# Patient Record
Sex: Female | Born: 1950 | Race: White | Hispanic: No | Smoking: Current every day smoker
Health system: Southern US, Community
[De-identification: ages and names within clinical notes are randomized; demographics above are authoritative.]

## PROBLEM LIST (undated history)

## (undated) DIAGNOSIS — J209 Acute bronchitis, unspecified: Principal | ICD-10-CM

## (undated) DIAGNOSIS — F54 Psychological and behavioral factors associated with disorders or diseases classified elsewhere: Secondary | ICD-10-CM

## (undated) DIAGNOSIS — F419 Anxiety disorder, unspecified: Secondary | ICD-10-CM

## (undated) DIAGNOSIS — E039 Hypothyroidism, unspecified: Secondary | ICD-10-CM

## (undated) DIAGNOSIS — J189 Pneumonia, unspecified organism: Secondary | ICD-10-CM

## (undated) DIAGNOSIS — E119 Type 2 diabetes mellitus without complications: Secondary | ICD-10-CM

## (undated) DIAGNOSIS — F32A Depression, unspecified: Secondary | ICD-10-CM

## (undated) DIAGNOSIS — F329 Major depressive disorder, single episode, unspecified: Secondary | ICD-10-CM

## (undated) DIAGNOSIS — I2510 Atherosclerotic heart disease of native coronary artery without angina pectoris: Secondary | ICD-10-CM

## (undated) DIAGNOSIS — E785 Hyperlipidemia, unspecified: Secondary | ICD-10-CM

## (undated) DIAGNOSIS — T1491XA Suicide attempt, initial encounter: Secondary | ICD-10-CM

## (undated) DIAGNOSIS — R631 Polydipsia: Secondary | ICD-10-CM

## (undated) HISTORY — DX: Hypothyroidism, unspecified: E03.9

## (undated) HISTORY — DX: Type 2 diabetes mellitus without complications: E11.9

## (undated) HISTORY — DX: Suicide attempt, initial encounter: T14.91XA

## (undated) HISTORY — DX: Pneumonia, unspecified organism: J18.9

## (undated) HISTORY — DX: Anxiety disorder, unspecified: F41.9

## (undated) HISTORY — PX: OTHER SURGICAL HISTORY: SHX169

## (undated) HISTORY — DX: Major depressive disorder, single episode, unspecified: F32.9

## (undated) HISTORY — DX: Atherosclerotic heart disease of native coronary artery without angina pectoris: I25.10

## (undated) HISTORY — DX: Psychological and behavioral factors associated with disorders or diseases classified elsewhere: F54

## (undated) HISTORY — PX: TONSILLECTOMY: SHX5217

## (undated) HISTORY — DX: Depression, unspecified: F32.A

## (undated) HISTORY — DX: Polydipsia: R63.1

## (undated) HISTORY — DX: Hyperlipidemia, unspecified: E78.5

## (undated) HISTORY — DX: Acute bronchitis, unspecified: J20.9

---

## 1997-06-05 ENCOUNTER — Other Ambulatory Visit: Admission: RE | Admit: 1997-06-05 | Discharge: 1997-06-05 | Payer: Self-pay | Admitting: Internal Medicine

## 1997-07-09 ENCOUNTER — Other Ambulatory Visit: Admission: RE | Admit: 1997-07-09 | Discharge: 1997-07-09 | Payer: Self-pay | Admitting: Radiology

## 1998-01-13 ENCOUNTER — Other Ambulatory Visit: Admission: RE | Admit: 1998-01-13 | Discharge: 1998-01-13 | Payer: Self-pay | Admitting: Obstetrics and Gynecology

## 1998-01-30 DIAGNOSIS — F54 Psychological and behavioral factors associated with disorders or diseases classified elsewhere: Secondary | ICD-10-CM

## 1998-01-30 HISTORY — DX: Psychological and behavioral factors associated with disorders or diseases classified elsewhere: F54

## 1998-11-28 ENCOUNTER — Emergency Department (HOSPITAL_COMMUNITY): Admission: EM | Admit: 1998-11-28 | Discharge: 1998-11-28 | Payer: Self-pay | Admitting: Internal Medicine

## 1998-11-29 ENCOUNTER — Inpatient Hospital Stay (HOSPITAL_COMMUNITY): Admission: EM | Admit: 1998-11-29 | Discharge: 1998-12-01 | Payer: Self-pay | Admitting: Infectious Diseases

## 2000-02-07 ENCOUNTER — Other Ambulatory Visit: Admission: RE | Admit: 2000-02-07 | Discharge: 2000-02-07 | Payer: Self-pay | Admitting: Obstetrics and Gynecology

## 2000-06-22 ENCOUNTER — Encounter: Payer: Self-pay | Admitting: Internal Medicine

## 2000-06-22 ENCOUNTER — Ambulatory Visit (HOSPITAL_COMMUNITY): Admission: RE | Admit: 2000-06-22 | Discharge: 2000-06-22 | Payer: Self-pay | Admitting: Internal Medicine

## 2000-10-08 ENCOUNTER — Ambulatory Visit (HOSPITAL_BASED_OUTPATIENT_CLINIC_OR_DEPARTMENT_OTHER): Admission: RE | Admit: 2000-10-08 | Discharge: 2000-10-08 | Payer: Self-pay | Admitting: Orthopedic Surgery

## 2001-04-09 ENCOUNTER — Other Ambulatory Visit: Admission: RE | Admit: 2001-04-09 | Discharge: 2001-04-09 | Payer: Self-pay | Admitting: Obstetrics and Gynecology

## 2004-04-08 ENCOUNTER — Ambulatory Visit: Payer: Self-pay | Admitting: Internal Medicine

## 2004-06-03 ENCOUNTER — Ambulatory Visit: Payer: Self-pay | Admitting: Internal Medicine

## 2004-07-15 ENCOUNTER — Ambulatory Visit: Payer: Self-pay | Admitting: Internal Medicine

## 2005-01-30 HISTORY — PX: APPENDECTOMY: SHX54

## 2005-07-04 ENCOUNTER — Ambulatory Visit: Payer: Self-pay | Admitting: Internal Medicine

## 2005-07-30 DIAGNOSIS — J189 Pneumonia, unspecified organism: Secondary | ICD-10-CM

## 2005-07-30 HISTORY — DX: Pneumonia, unspecified organism: J18.9

## 2005-08-23 ENCOUNTER — Ambulatory Visit: Payer: Self-pay | Admitting: Internal Medicine

## 2005-08-23 ENCOUNTER — Inpatient Hospital Stay (HOSPITAL_COMMUNITY): Admission: AD | Admit: 2005-08-23 | Discharge: 2005-08-27 | Payer: Self-pay | Admitting: Internal Medicine

## 2005-08-24 ENCOUNTER — Ambulatory Visit: Payer: Self-pay | Admitting: Internal Medicine

## 2005-09-07 ENCOUNTER — Ambulatory Visit: Payer: Self-pay | Admitting: Internal Medicine

## 2006-06-17 ENCOUNTER — Emergency Department (HOSPITAL_COMMUNITY): Admission: EM | Admit: 2006-06-17 | Discharge: 2006-06-17 | Payer: Self-pay | Admitting: Emergency Medicine

## 2006-07-05 ENCOUNTER — Ambulatory Visit: Payer: Self-pay | Admitting: Internal Medicine

## 2006-07-11 ENCOUNTER — Ambulatory Visit: Payer: Self-pay | Admitting: Internal Medicine

## 2006-08-17 ENCOUNTER — Ambulatory Visit: Payer: Self-pay | Admitting: Internal Medicine

## 2006-08-17 LAB — CONVERTED CEMR LAB
ALT: 28 units/L (ref 0–35)
Albumin: 3.6 g/dL (ref 3.5–5.2)
Alkaline Phosphatase: 70 units/L (ref 39–117)
Cholesterol: 147 mg/dL (ref 0–200)
Glucose, Bld: 90 mg/dL (ref 70–99)
Total Bilirubin: 0.6 mg/dL (ref 0.3–1.2)
Total CHOL/HDL Ratio: 3.1
Total Protein: 6.4 g/dL (ref 6.0–8.3)

## 2006-09-07 ENCOUNTER — Ambulatory Visit: Payer: Self-pay | Admitting: Internal Medicine

## 2006-09-21 ENCOUNTER — Encounter: Admission: RE | Admit: 2006-09-21 | Discharge: 2006-09-21 | Payer: Self-pay | Admitting: Internal Medicine

## 2006-11-01 ENCOUNTER — Ambulatory Visit (HOSPITAL_COMMUNITY): Admission: RE | Admit: 2006-11-01 | Discharge: 2006-11-01 | Payer: Self-pay | Admitting: Family Medicine

## 2006-11-02 ENCOUNTER — Inpatient Hospital Stay (HOSPITAL_COMMUNITY): Admission: EM | Admit: 2006-11-02 | Discharge: 2006-11-04 | Payer: Self-pay | Admitting: Emergency Medicine

## 2006-11-02 ENCOUNTER — Encounter (INDEPENDENT_AMBULATORY_CARE_PROVIDER_SITE_OTHER): Payer: Self-pay | Admitting: General Surgery

## 2007-02-15 ENCOUNTER — Ambulatory Visit: Payer: Self-pay | Admitting: Internal Medicine

## 2007-02-15 DIAGNOSIS — E039 Hypothyroidism, unspecified: Secondary | ICD-10-CM | POA: Insufficient documentation

## 2007-02-15 DIAGNOSIS — F329 Major depressive disorder, single episode, unspecified: Secondary | ICD-10-CM

## 2007-02-15 DIAGNOSIS — E785 Hyperlipidemia, unspecified: Secondary | ICD-10-CM

## 2007-02-15 DIAGNOSIS — E119 Type 2 diabetes mellitus without complications: Secondary | ICD-10-CM

## 2007-02-15 DIAGNOSIS — F411 Generalized anxiety disorder: Secondary | ICD-10-CM | POA: Insufficient documentation

## 2007-02-15 HISTORY — DX: Type 2 diabetes mellitus without complications: E11.9

## 2007-07-05 ENCOUNTER — Telehealth: Payer: Self-pay | Admitting: Internal Medicine

## 2007-07-12 ENCOUNTER — Ambulatory Visit: Payer: Self-pay | Admitting: Internal Medicine

## 2007-07-12 LAB — CONVERTED CEMR LAB
ALT: 25 units/L (ref 0–35)
AST: 23 units/L (ref 0–37)
Albumin: 3.6 g/dL (ref 3.5–5.2)
BUN: 15 mg/dL (ref 6–23)
CO2: 28 meq/L (ref 19–32)
Calcium: 9.3 mg/dL (ref 8.4–10.5)
Chloride: 106 meq/L (ref 96–112)
Cholesterol: 158 mg/dL (ref 0–200)
Creatinine, Ser: 0.6 mg/dL (ref 0.4–1.2)
HDL: 42.9 mg/dL (ref >39.0)
Hgb A1c MFr Bld: 6.3 % — ABNORMAL HIGH (ref 4.6–6.0)
LDL Cholesterol: 107 mg/dL — ABNORMAL HIGH (ref 0–99)
TSH: 0.38 microintl units/mL (ref 0.35–5.50)
Triglycerides: 40 mg/dL (ref 0–149)

## 2007-08-16 ENCOUNTER — Ambulatory Visit: Payer: Self-pay | Admitting: Internal Medicine

## 2007-08-16 DIAGNOSIS — K589 Irritable bowel syndrome without diarrhea: Secondary | ICD-10-CM | POA: Insufficient documentation

## 2007-09-11 ENCOUNTER — Encounter: Payer: Self-pay | Admitting: Internal Medicine

## 2007-10-04 ENCOUNTER — Ambulatory Visit: Payer: Self-pay | Admitting: Internal Medicine

## 2007-10-04 DIAGNOSIS — R42 Dizziness and giddiness: Secondary | ICD-10-CM | POA: Insufficient documentation

## 2007-10-04 DIAGNOSIS — I4581 Long QT syndrome: Secondary | ICD-10-CM

## 2007-10-28 ENCOUNTER — Telehealth (INDEPENDENT_AMBULATORY_CARE_PROVIDER_SITE_OTHER): Payer: Self-pay | Admitting: *Deleted

## 2007-11-06 ENCOUNTER — Ambulatory Visit: Payer: Self-pay

## 2007-11-06 ENCOUNTER — Encounter: Payer: Self-pay | Admitting: Internal Medicine

## 2007-11-07 ENCOUNTER — Ambulatory Visit: Payer: Self-pay | Admitting: Internal Medicine

## 2008-01-31 ENCOUNTER — Emergency Department (HOSPITAL_COMMUNITY): Admission: EM | Admit: 2008-01-31 | Discharge: 2008-01-31 | Payer: Self-pay | Admitting: Emergency Medicine

## 2008-07-31 ENCOUNTER — Ambulatory Visit: Payer: Self-pay | Admitting: Internal Medicine

## 2008-07-31 DIAGNOSIS — R109 Unspecified abdominal pain: Secondary | ICD-10-CM

## 2008-07-31 LAB — CONVERTED CEMR LAB
Albumin: 3.5 g/dL (ref 3.5–5.2)
Alkaline Phosphatase: 73 units/L (ref 39–117)
Basophils Relative: 0.1 % (ref 0.0–3.0)
CO2: 29 meq/L (ref 19–32)
Chloride: 106 meq/L (ref 96–112)
Eosinophils Absolute: 0 10*3/uL (ref 0.0–0.7)
HCT: 40.2 % (ref 36.0–46.0)
Hemoglobin: 13.9 g/dL (ref 12.0–15.0)
Hgb A1c MFr Bld: 6.3 % (ref 4.6–6.5)
Lymphocytes Relative: 10.3 % — ABNORMAL LOW (ref 12.0–46.0)
Lymphs Abs: 1.4 10*3/uL (ref 0.7–4.0)
MCHC: 34.5 g/dL (ref 30.0–36.0)
MCV: 97.8 fL (ref 78.0–100.0)
Monocytes Absolute: 0.7 10*3/uL (ref 0.1–1.0)
Neutro Abs: 11.8 10*3/uL — ABNORMAL HIGH (ref 1.4–7.7)
Potassium: 3.6 meq/L (ref 3.5–5.1)
RBC: 4.11 M/uL (ref 3.87–5.11)
Sodium: 140 meq/L (ref 135–145)
Total CHOL/HDL Ratio: 3
Total Protein: 6.5 g/dL (ref 6.0–8.3)
Urine Glucose: NEGATIVE mg/dL
Urobilinogen, UA: 0.2 (ref 0.0–1.0)

## 2008-07-31 LAB — HM DIABETES FOOT EXAM

## 2008-08-04 ENCOUNTER — Encounter: Payer: Self-pay | Admitting: Internal Medicine

## 2008-08-05 ENCOUNTER — Ambulatory Visit: Payer: Self-pay | Admitting: Internal Medicine

## 2008-08-05 ENCOUNTER — Encounter (INDEPENDENT_AMBULATORY_CARE_PROVIDER_SITE_OTHER): Payer: Self-pay | Admitting: *Deleted

## 2008-08-05 DIAGNOSIS — R1032 Left lower quadrant pain: Secondary | ICD-10-CM | POA: Insufficient documentation

## 2008-10-21 ENCOUNTER — Ambulatory Visit: Payer: Self-pay | Admitting: Internal Medicine

## 2008-10-21 DIAGNOSIS — J019 Acute sinusitis, unspecified: Secondary | ICD-10-CM

## 2008-10-26 ENCOUNTER — Telehealth: Payer: Self-pay | Admitting: Internal Medicine

## 2008-10-28 ENCOUNTER — Ambulatory Visit: Payer: Self-pay | Admitting: Internal Medicine

## 2009-05-07 ENCOUNTER — Encounter: Payer: Self-pay | Admitting: Internal Medicine

## 2009-07-02 ENCOUNTER — Ambulatory Visit: Payer: Self-pay | Admitting: Internal Medicine

## 2009-07-02 DIAGNOSIS — D72829 Elevated white blood cell count, unspecified: Secondary | ICD-10-CM | POA: Insufficient documentation

## 2009-07-02 LAB — CONVERTED CEMR LAB
ALT: 18 units/L (ref 0–35)
Alkaline Phosphatase: 72 units/L (ref 39–117)
Basophils Absolute: 0.1 10*3/uL (ref 0.0–0.1)
Bilirubin, Direct: 0.1 mg/dL (ref 0.0–0.3)
Calcium: 9.4 mg/dL (ref 8.4–10.5)
Cholesterol: 159 mg/dL (ref 0–200)
Eosinophils Absolute: 0 10*3/uL (ref 0.0–0.7)
GFR calc non Af Amer: 115.19 mL/min (ref >60)
HCT: 40.8 % (ref 36.0–46.0)
Lymphs Abs: 2.4 10*3/uL (ref 0.7–4.0)
MCV: 97.9 fL (ref 78.0–100.0)
Monocytes Absolute: 0.5 10*3/uL (ref 0.1–1.0)
Platelets: 215 10*3/uL (ref 150.0–400.0)
RDW: 13.8 % (ref 11.5–14.6)
Sodium: 141 meq/L (ref 135–145)
Total Protein: 6.6 g/dL (ref 6.0–8.3)
Triglycerides: 54 mg/dL (ref 0.0–149.0)

## 2009-07-30 ENCOUNTER — Ambulatory Visit: Payer: Self-pay | Admitting: Internal Medicine

## 2009-08-20 ENCOUNTER — Ambulatory Visit: Payer: Self-pay | Admitting: Internal Medicine

## 2009-09-08 ENCOUNTER — Ambulatory Visit: Payer: Self-pay | Admitting: Internal Medicine

## 2009-09-09 ENCOUNTER — Telehealth: Payer: Self-pay | Admitting: Internal Medicine

## 2009-09-14 ENCOUNTER — Ambulatory Visit: Payer: Self-pay | Admitting: Internal Medicine

## 2009-09-14 ENCOUNTER — Encounter: Payer: Self-pay | Admitting: Internal Medicine

## 2009-09-16 ENCOUNTER — Telehealth (INDEPENDENT_AMBULATORY_CARE_PROVIDER_SITE_OTHER): Payer: Self-pay | Admitting: *Deleted

## 2009-09-20 ENCOUNTER — Encounter: Payer: Self-pay | Admitting: Internal Medicine

## 2010-02-20 ENCOUNTER — Encounter: Payer: Self-pay | Admitting: Internal Medicine

## 2010-03-01 NOTE — Progress Notes (Signed)
Summary: ALT med  Phone Note Call from Patient   Caller: Patient [phone] Summary of Call: Pt called stating that she may have had a reaction to Levothyroxine but she will not longer continue on this medication. Pt states that she has not been taking medications for a while (pt was unclear on if she stopped medications due to reaction, type of reaction, or when she had the reaction). Pt is requesting an alternative medication, Synthroid. Pt says she will pick Rx up in office tomorrow 09/10/2009 Initial call taken by: Margaret Pyle, CMA,  September 09, 2009 3:45 PM  Follow-up for Phone Call        ok to change to levothyroid same strength - to robin to handle Follow-up by: Corwin Levins MD,  September 09, 2009 4:23 PM  Additional Follow-up for Phone Call Additional follow up Details #1::        completed new prescription, patient will pickup at front desk 09/10/09. Informed pt. prescription is ready. Additional Follow-up by: Robin Ewing CMA (AAMA),  September 10, 2009 9:06 AM    New/Updated Medications: LEVOTHROID 125 MCG TABS (LEVOTHYROXINE SODIUM) 1 by mouth once daily Prescriptions: LEVOTHROID 125 MCG TABS (LEVOTHYROXINE SODIUM) 1 by mouth once daily  #30 x 11   Entered by:   Zella Ball Ewing CMA (AAMA)   Authorized by:   Corwin Levins MD   Signed by:   Scharlene Gloss CMA (AAMA) on 09/10/2009   Method used:   Print then Give to Patient   RxID:   [id number]

## 2010-03-01 NOTE — Miscellaneous (Signed)
Summary: Orders Update  Clinical Lists Changes  Orders: Added new Test order of Carotid Duplex (Carotid Duplex) - Signed 

## 2010-03-01 NOTE — Letter (Signed)
Summary: [facility]   Imported By: Lester [place] 05/19/2009 09:59:43  _____________________________________________________________________  External Attachment:    Type:   Image     Comment:   External Document

## 2010-03-01 NOTE — Miscellaneous (Signed)
Summary: Orders Update  Clinical Lists Changes  Orders: Added new Service order of EKG w/ Interpretation (93000) - Signed 

## 2010-03-01 NOTE — Assessment & Plan Note (Signed)
Summary: SORE THROAT/NWS   Vital Signs:  Patient profile:   60 year old female Height:      61 inches Weight:      108.50 pounds BMI:     20.57 O2 Sat:      97 % on Room air Temp:     99.1 degrees F oral Pulse rate:   77 / minute BP sitting:   92 / 60  (left arm) Cuff size:   regular  Vitals Entered By: Zella Ball Ewing CMA Duncan Dull) (August 20, 2009 2:24 PM)  O2 Flow:  Room air CC: Sore Throat/RE   Primary Care Provider:  Corwin Levins MD  CC:  Sore Throat/RE.  History of Present Illness: Here with acute onset x 3 days fever, ST , headache, general aches , weakness and malaise, but Pt denies CP, sob, doe, wheezing, orthopnea, pnd, worsening LE edema, palps, dizziness or syncope  co-worker with proven strep.  Denies hyper or hypothyroid symtpoms such as wt/voice/skin changes.  Pt denies polydipsia, polyuria, or low sugar symptoms such as shakiness improved with eating.  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 1po Qd 2)  Lovastatin 20 Mg  Tabs (Lovastatin) .Marland Kitchen.. 1 By Mouth Once Daily 3)  Levothyroxine Sodium 125 Mcg Tabs (Levothyroxine Sodium) .Marland Kitchen.. 1po Once Daily 4)  Ecotrin Low Strength 81 Mg  Tbec (Aspirin) .Marland Kitchen.. 1po Qd 5)  Advil 200 Mg  Tabs (Ibuprofen) .Marland Kitchen.. 1 By Mouth Once Daily  Prn 6)  Accu-Chek Aviva   Strp (Glucose Blood) .... Use As Directed 7)  Risperdal 3 Mg Tabs (Risperidone) .... Take 1 Tablet By Mouth Two Times A Day  Current Medications (verified): 1)  Sertraline Hcl 100 Mg  Tabs (Sertraline Hcl) .Marland Kitchen.. 1po Qd 2)  Lovastatin 20 Mg  Tabs (Lovastatin) .Marland Kitchen.. 1 By Mouth Once Daily 3)  Levothyroxine Sodium 125 Mcg Tabs (Levothyroxine Sodium) .Marland Kitchen.. 1po Once Daily 4)  Ecotrin Low Strength 81 Mg  Tbec (Aspirin) .Marland Kitchen.. 1po Qd 5)  Advil 200 Mg  Tabs (Ibuprofen) .Marland Kitchen.. 1 By Mouth Once Daily Prn 6)  Accu-Chek Aviva   Strp (Glucose Blood) .... Use As Directed 7)  Risperdal 3 Mg Tabs (Risperidone) .... Take 1 Tablet By Mouth Two Times A Day 8)  Azithromycin 250 Mg Tabs (Azithromycin) .... 2po Qd For 1 Day, Then 1po Qd For 4days, Then Stop  Allergies (verified): 1)  ! Prozac 2)  ! 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  , mild ill  Head:  normocephalic and atraumatic.   Eyes:  vision grossly intact, pupils equal, and pupils round.   Ears:  bilat tm's red, sinus nontender Nose:  nasal dischargemucosal pallor and mucosal edema.   Mouth:  pharyngeal erythema and fair dentition.   Neck:  supple and cervical lymphadenopathy.   Lungs:  normal respiratory effort and normal breath sounds.   Heart:  normal rate and regular rhythm.   Extremities:   no edema, no erythema    Impression & Recommendations:  Problem # 1:  PHARYNGITIS-ACUTE (ICD-462)  Her updated medication list for this problem includes:    Ecotrin Low Strength 81 Mg Tbec (Aspirin) .Marland Kitchen... 1po qd    Advil 200 Mg Tabs (Ibuprofen) .Marland Kitchen... 1 by mouth once daily prn    Azithromycin 250 Mg Tabs (Azithromycin) .Marland Kitchen... 2po qd for 1 day, then 1po qd for 4days, then stop treat as above, f/u any worsening signs or symptoms   Problem # 2:  DIABETES MELLITUS, TYPE II (ICD-250.00)  Her updated medication list for this problem includes:    Ecotrin Low Strength 81 Mg Tbec (Aspirin) .Marland Kitchen... 1po qd  Labs Reviewed: Creat: 0.6 (07/02/2009)    Reviewed HgBA1c results: 6.4 (07/02/2009)  6.3 (07/31/2008) stable overall by hx and exam, ok to continue meds/tx as is   Problem # 3:  HYPOTHYROIDISM (ICD-244.9)  Her updated medication list for this problem includes:    Levothyroxine Sodium 125 Mcg Tabs (Levothyroxine sodium) .Marland Kitchen... 1po once daily  Labs Reviewed: TSH: 0.38 (07/30/2009)    HgBA1c: 6.4 (07/02/2009) Chol: 159 (07/02/2009)   HDL: 49.10 (07/02/2009)   LDL: 99 (07/02/2009)   TG: 54.0 (07/02/2009) stable overall by hx and exam, ok to continue meds/tx as is   Complete Medication List: 1)  Sertraline Hcl 100 Mg Tabs (Sertraline hcl) .Marland Kitchen.. 1po qd 2)  Lovastatin 20 Mg Tabs (Lovastatin) .Marland Kitchen.. 1 by mouth once daily 3)  Levothyroxine Sodium 125 Mcg Tabs (Levothyroxine sodium) .Marland Kitchen.. 1po once daily 4)  Ecotrin Low Strength 81 Mg Tbec (Aspirin) .Marland Kitchen.. 1po qd 5)  Advil 200 Mg Tabs (Ibuprofen) .Marland Kitchen.. 1 by mouth once daily prn 6)  Accu-chek Aviva Strp (Glucose blood) .... Use as directed 7)  Risperdal 3 Mg Tabs (Risperidone) .... Take 1 tablet by mouth two times a day 8)  Azithromycin 250 Mg Tabs (Azithromycin) .... 2po qd for 1 day, then 1po qd for 4days, then stop  Patient Instructions: 1)  Please take all new medications as prescribed 2)  Continue all previous medications as before this visit    3)  You can also use Mucinex OTC or it's generic for congestion  4)  Please schedule a follow-up appointment as needed. Prescriptions: AZITHROMYCIN 250 MG TABS (AZITHROMYCIN) 2po qd for 1 day, then 1po qd for 4days, then stop  #6 x 1   Entered and Authorized by:   Corwin Levins MD   Signed by:   Corwin Levins MD on 08/20/2009   Method used:   Print then Give to Patient   RxID:   [id number]

## 2010-03-01 NOTE — Assessment & Plan Note (Signed)
Summary: FU / MAY NEED LABS/ NWS   Vital Signs:  Patient profile:   60 year old female Height:      61 inches Weight:      109.75 pounds BMI:     20.81 O2 Sat:      99 % on Room air Temp:     96.6 degrees F oral Pulse rate:   68 / minute BP sitting:   132 / 84  (left arm) Cuff size:   regular  Vitals Entered ByZella Ball Ewing (July 02, 2009 9:17 AM)  O2 Flow:  Room air  CC: followup/RE   Primary Care Provider:  Corwin Levins MD  CC:  followup/RE.  History of Present Illness: on risperdal gained 6 lbs since last yr; Pt denies CP, sob, doe, wheezing, orthopnea, pnd, worsening LE edema, palps, dizziness or syncope  Pt denies new neuro symptoms such as headache, facial or extremity weakness   trying to follow lower chol diet;  denies polydipsia or polyuria.    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 1po Qd 2)  Lovastatin 20 Mg  Tabs (Lovastatin) .Marland Kitchen.. 1 By Mouth Qd 3)  Levothyroxine Sodium 150 Mcg  Tabs (Levothyroxine Sodium) .Marland Kitchen.. 1 By Mouth Qd 4)  Ecotrin Low Strength 81 Mg  Tbec (Aspirin) .Marland Kitchen.. 1po Qd 5)  Advil 200 Mg  Tabs (Ibuprofen) .Marland Kitchen.. 1 By Mouth Once Daily Prn 6)  Accu-Chek Aviva   Strp (Glucose Blood) .... Use As Directed 7)  Risperdal 3 Mg Tabs (Risperidone) .... Take 1 Tablet By Mouth Two Times A Day 8)  Tessalon Perles 100 Mg Caps (Benzonatate) .Marland Kitchen.. 1 - 2 By Mouth Three Times A Day As Needed For Cough 9)  Zithromax Z-Pak 250 Mg Tabs (Azithromycin)  Current Medications  (verified): 1)  Sertraline Hcl 100 Mg  Tabs (Sertraline Hcl) .Marland Kitchen.. 1po Qd 2)  Lovastatin 20 Mg  Tabs (Lovastatin) .Marland Kitchen.. 1 By Mouth Once Daily 3)  Levothyroxine Sodium 150 Mcg  Tabs (Levothyroxine Sodium) .Marland Kitchen.. 1 By Mouth Once Daily 4)  Ecotrin Low Strength 81 Mg  Tbec (Aspirin) .Marland Kitchen.. 1po Qd 5)  Advil 200 Mg  Tabs (Ibuprofen) .Marland Kitchen.. 1 By Mouth Once Daily Prn 6)  Accu-Chek Aviva   Strp (Glucose Blood) .... Use As Directed 7)  Risperdal 3 Mg Tabs (Risperidone) .... Take 1 Tablet By Mouth Two Times A Day  Allergies (verified): 1)  ! Prozac 2)  ! 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   Head:  normocephalic and atraumatic.   Eyes:  vision grossly intact, pupils equal, and pupils round.   Ears:  R ear normal and L ear normal.   Nose:  no external deformity and no nasal discharge.   Mouth:  no gingival abnormalities and pharynx pink and moist.   Neck:  supple and no masses.   Lungs:  normal respiratory effort and normal breath sounds.   Heart:  normal rate and regular rhythm.   Abdomen:  soft, non-tender, and normal bowel sounds.   Msk:  no joint tenderness and no joint swelling.   Extremities:   no edema, no erythema  Neurologic:  cranial nerves II-XII intact and strength normal in all extremities.   Psych:  normally interactive and not anxious appearing.     Impression & Recommendations:  Problem # 1:  HYPERLIPIDEMIA (ICD-272.4)  Her updated medication list for this problem includes:    Lovastatin 20 Mg Tabs (Lovastatin) .Marland Kitchen... 1 by mouth once daily  Orders: TLB-Hepatic/Liver Function Pnl (80076-HEPATIC)  Labs Reviewed: SGOT: 21 (07/31/2008)   SGPT: 24 (07/31/2008)   HDL:51.30 (07/31/2008), 42.9 (07/12/2007)  LDL:87 (07/31/2008), 107 (40/98/1191)  Chol:145 (07/31/2008), 158 (07/12/2007)  Trig:34.0 (07/31/2008), 40 (07/12/2007) stable overall by hx and exam, ok to continue meds/tx as is   Problem # 2:  DIABETES MELLITUS, TYPE II (ICD-250.00)  Her updated medication list for this problem includes:    Ecotrin Low Strength 81 Mg Tbec (Aspirin) .Marland Kitchen... 1po qd  Orders: TLB-BMP (Basic Metabolic Panel-BMET) (80048-METABOL) TLB-A1C / Hgb A1C (Glycohemoglobin) (83036-A1C) TLB-Lipid Panel (80061-LIPID)  Labs Reviewed: Creat: 0.6 (07/31/2008)    Reviewed HgBA1c results: 6.3 (07/31/2008)  6.3 (07/12/2007) stable overall by hx and exam, ok to continue meds/tx as is   Problem # 3:  HYPOTHYROIDISM (ICD-244.9)  Her updated medication list for this problem includes:    Levothyroxine Sodium 125 Mcg Tabs (Levothyroxine sodium) .Marland Kitchen... 1po once daily  Orders: TLB-TSH (Thyroid Stimulating Hormone) (84443-TSH)  Labs Reviewed: TSH: 1.45 (07/31/2008)    HgBA1c: 6.3 (07/31/2008) Chol: 145 (07/31/2008)   HDL: 51.30 (07/31/2008)   LDL: 87 (07/31/2008)   TG: 34.0 (07/31/2008) stable overall by hx and exam, ok to continue meds/tx as is , to check TSH today  Problem # 4:  LEUKOCYTOSIS (ICD-288.60)  Orders: TLB-CBC Platelet - w/Differential (85025-CBCD) mild previously, ? clinical significance - to re-check  Complete Medication List: 1)  Sertraline Hcl 100 Mg Tabs (Sertraline hcl) .Marland Kitchen..  1po qd 2)  Lovastatin 20 Mg Tabs (Lovastatin) .Marland Kitchen.. 1 by mouth once daily 3)  Levothyroxine Sodium 125 Mcg Tabs (Levothyroxine sodium) .Marland Kitchen.. 1po once daily 4)  Ecotrin Low Strength 81 Mg Tbec (Aspirin) .Marland Kitchen.. 1po qd 5)  Advil 200 Mg Tabs (Ibuprofen) .Marland Kitchen.. 1 by mouth once daily prn 6)  Accu-chek Aviva Strp (Glucose blood) .... Use as directed 7)  Risperdal 3 Mg Tabs (Risperidone) .... Take 1 tablet by mouth two times a day  Patient Instructions: 1)  please go to [email or web address].[email or web address] for infomation about health insurance  2)  Continue all previous medications as before this visit  3)  Please go to the Lab in the basement for your blood and/or urine tests today  4)  Please schedule a follow-up appointment in 1 year or sooner if needed Prescriptions: ACCU-CHEK AVIVA   STRP (  GLUCOSE BLOOD) Use as directed  #100 x 3   Entered and Authorized by:   Corwin Levins MD   Signed by:   Corwin Levins MD on 07/02/2009   Method used:   Print then Give to Patient   RxID:   [id number] LEVOTHYROXINE SODIUM 150 MCG  TABS (LEVOTHYROXINE SODIUM) 1 by mouth once daily  #90 x 3   Entered and Authorized by:   Corwin Levins MD   Signed by:   Corwin Levins MD on 07/02/2009   Method used:   Print then Give to Patient   RxID:   [id number] LOVASTATIN 20 MG  TABS (LOVASTATIN) 1 by mouth once daily  #90 x 3   Entered and Authorized by:   Corwin Levins MD   Signed by:   Corwin Levins MD on 07/02/2009   Method used:   Print then Give to Patient   RxID:   [id number]

## 2010-03-01 NOTE — Progress Notes (Signed)
----   Converted from flag ---- ---- 09/15/2009 11:28 AM, Edman Circle wrote: appt 8/23 @ 4:00  ---- 09/15/2009 11:13 AM, Dagoberto Reef wrote: Thanks  ---- 09/14/2009 3:31 PM, Corwin Levins MD wrote: The following orders have been entered for this patient and placed on Admin Hold:  Type:     Referral       Code:   Radiology Description:   Radiology Referral Order Date:   09/14/2009   Authorized By:   Corwin Levins MD Order #:   [id number] Clinical Notes:   Name of Test or Procedure: carotid dopplers  Of What:  Special Instructions ------------------------------

## 2010-03-01 NOTE — Assessment & Plan Note (Signed)
Summary: HEAD FEEL FUNNY X AWHILE--STC   Vital Signs:  Patient profile:   60 year old female Height:      61 inches Weight:      109.25 pounds BMI:     20.72 O2 Sat:      97 % on Room air Temp:     99.6 degrees F oral Pulse rate:   73 / minute BP supine:   120 / 92  (right arm) BP sitting:   110 / 86  (right arm) BP standing:   112 / 88  (right arm) Cuff size:   regular  Vitals Entered By: Zella Ball Ewing CMA Duncan Dull) (September 14, 2009 2:27 PM)  O2 Flow:  Room air CC: Continued to be dizzy in the mornings after taking medication/RE   Primary Care Provider:  Corwin Levins MD  CC:  Continued to be dizzy in the mornings after taking medication/RE.  History of Present Illness: here to f/u; unfortuantely still with signifciant postioinal dizziness and "just feeling funny in the head" worse in the am to first getting up in the AM despite change of thyroid med, and even holdiing her risperdal in the am only for the last 3 days b/c the information with the risperdal mentioned dizziness, but did not make any difference in how she felt;  recent labs reviewed with  pt, as well as normal 2009 echo;  Pt denies CP, sob, doe, wheezing, orthopnea, pnd, worsening LE edema, palps,  or syncope Pt denies new neuro symptoms such as headache, facial or extremity weakness  No fever, wt loss, night sweats, loss of appetite or other constitutional symptoms  No headache, ST, cough, chills, abd pain, n/v and very difficult to say if her dizziness is vertigo-like, faintness or dysquilibrium like.  Overall o/w good med complaicne and tolerance.   Denies worsening hyper or hypothyroid symtpoms, such as voice/wt/skin changes.Denies polydipsia or polyuria.   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 1po Qd 2)  Lovastatin 20 Mg  Tabs (Lovastatin) .Marland Kitchen.. 1 By Mouth Once Daily 3)  Levothyroxine Sodium 125 Mcg Tabs (Levothyroxine Sodium) .Marland Kitchen.. 1po Once Daily 4)  Ecotrin Low Strength 81 Mg  Tbec (Aspirin) .Marland Kitchen.. 1po Qd 5)  Advil 200 Mg  Tabs (Ibuprofen) .Marland Kitchen.. 1 By Mouth Once Daily Prn 6)  Accu-Chek Aviva   Strp (Glucose Blood) .... Use As Directed 7)  Risperdal 3 Mg Tabs (Risperidone) .... Take 1 Tablet By Mouth Two Times A Day 8)  Azithromycin 250 Mg Tabs (Azithromycin) .... 2po Qd For 1 Day, Then 1po Qd For 4days, Then Stop 9)  Levothroid 125 Mcg Tabs (Levothyroxine Sodium) .Marland Kitchen.. 1 By Mouth Once Daily  Current Medications (verified): 1)  Sertraline Hcl 100 Mg  Tabs (Sertraline Hcl) .Marland Kitchen.. 1po Qd 2)  Lovastatin 20 Mg  Tabs (Lovastatin) .Marland Kitchen.. 1 By Mouth Once Daily 3)  Levothyroxine Sodium 125 Mcg Tabs (Levothyroxine Sodium) .Marland Kitchen.. 1po Once Daily 4)  Ecotrin Low Strength 81 Mg  Tbec (Aspirin) .Marland Kitchen.. 1po Qd 5)  Advil 200 Mg  Tabs (Ibuprofen) .Marland Kitchen.. 1 By Mouth Once Daily Prn 6)  Accu-Chek Aviva   Strp (Glucose Blood) .... Use As Directed 7)  Risperdal 3 Mg Tabs (Risperidone) .... Take 1 Tablet By Mouth Two Times A Day 8)  Azithromycin 250 Mg Tabs (Azithromycin) .... 2po Qd For 1 Day, Then 1po Qd For 4days, Then Stop 9)  Levothroid 125 Mcg Tabs (Levothyroxine Sodium) .Marland Kitchen.. 1 By Mouth Once Daily  Allergies (verified): 1)  ! Prozac 2)  ! 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   Head:  normocephalic and atraumatic.   Eyes:  vision grossly intact, pupils equal, and pupils round.   Ears:  R ear normal and L ear normal.   Nose:  no external deformity and no nasal discharge.   Mouth:  no gingival abnormalities and pharynx pink and moist.   Neck:  supple and no masses.   Lungs:  normal respiratory effort and normal breath sounds.   Heart:  normal rate and regular rhythm.   Abdomen:  soft, non-tender, and normal bowel sounds.   Msk:  no joint tenderness and no joint warmth.  , no flank tender Extremities:  no edema, no erythema  Neurologic:  cranial nerves II-XII intact, strength normal in all extremities, and gait normal.     Impression & Recommendations:  Problem # 1:  DIZZINESS (ICD-780.4)  ? mild orthostatic by BP today - ok for incr fluids, and extra salt over the next 3 to 5 days; also to check head mri and carotids;  recent echo 2009 reviewed with pt; consider neuro and/or card consult  Orders: Radiology Referral (Radiology) Radiology Referral (Radiology)  Problem # 2:  HYPOTHYROIDISM (ICD-244.9)  The following medications were removed from the medication list:    Levothyroxine Sodium 125 Mcg Tabs (Levothyroxine sodium) .Marland Kitchen... 1po once daily Her updated medication list for this problem includes:    Levothroid 125 Mcg Tabs (Levothyroxine sodium) .Marland Kitchen... 1 by mouth once daily  Labs Reviewed: TSH: 0.60 (09/08/2009)    HgBA1c: 6.4 (07/02/2009) Chol: 159 (07/02/2009)   HDL: 49.10 (07/02/2009)   LDL: 99 (07/02/2009)   TG:  54.0 (07/02/2009) stable overall by hx and exam, ok to continue meds/tx as is   Problem # 3:  DIABETES MELLITUS, TYPE II (ICD-250.00)  Her updated medication list for this problem includes:    Ecotrin Low Strength 81 Mg Tbec (Aspirin) .Marland Kitchen... 1po qd  Labs Reviewed: Creat: 0.6 (07/02/2009)    Reviewed HgBA1c results: 6.4 (07/02/2009)  6.3 (07/31/2008) stable overall by hx and exam, ok to continue meds/tx as is  - no need for OHA at this time .    Complete Medication List: 1)  Sertraline Hcl 100 Mg Tabs (Sertraline hcl) .Marland Kitchen.. 1po qd 2)  Lovastatin 20 Mg Tabs (Lovastatin) .Marland Kitchen.. 1 by mouth once daily 3)  Ecotrin Low Strength 81 Mg Tbec (Aspirin) .Marland Kitchen.. 1po qd 4)  Advil 200 Mg Tabs (Ibuprofen) .Marland Kitchen.. 1 by mouth once daily prn 5)  Accu-chek Aviva Strp (Glucose blood) .... Use as directed 6)  Risperdal 3 Mg Tabs (Risperidone) .... Take 1 tablet by mouth two times a day 7)  Levothroid 125 Mcg Tabs (Levothyroxine sodium) .Marland Kitchen.. 1 by mouth once daily  Patient Instructions: 1)  Continue all previous medications as before this visit  - including the thyroid and risperdal medications 2)  Please drink more fluids, and even use extra table salt over the next 3 to 5 days 3)  You will be contacted about the referral(s) to: Carotid dopplers, and Head MRI 4)  call if not improved in 1 wk to consider Neuro consult, or cardiology evaluation

## 2010-03-01 NOTE — Assessment & Plan Note (Signed)
Summary: dizziness and slow breatning---stc   Vital Signs:  Patient profile:   60 year old female Height:      61 inches Weight:      109.50 pounds BMI:     20.76 O2 Sat:      97 % on Room air Temp:     98.5 degrees F oral Pulse rate:   78 / minute BP sitting:   98 / 64  (left arm) Cuff size:   regular  Vitals Entered By: Zella Ball Ewing CMA Duncan Dull) (September 08, 2009 12:00 PM)  O2 Flow:  Room air CC: Dizzined and slowed breathing/RE   Primary Care Neema Fluegge:  Corwin Levins MD  CC:  Dizzined and slowed breathing/RE.  History of Present Illness: c/o 4 to 5 dyas onset dizziness that seemed to start with taking her meds in the am, breathing seemed to be slowed,  seemed better after eating  and 'just made me feel wierd.'  Quit taking her thyroid med yesterady and today to see if this would help., as well as less coffee.  No headache, fever, sinus complaints, and no ear pain, pressure,  but has had some ringing and  popping noises to both ears;  no ST, cough, Pt denies CP, sob, doe, wheezing, orthopnea, pnd, worsening LE edema, palps, or syncope   Pt denies new neuro symptoms such as headache, facial or extremity weakness .  Dizziness is mild , intermittent and worse with head movements , but no nausea.   Denies polydipsia or polyuria.    Problems Prior to Update: 1)  Dizziness  (ICD-780.4) 2)  Pharyngitis-acute  (ICD-462) 3)  Leukocytosis  (ICD-288.60) 4)  Sinusitis- Acute-nos  (ICD-461.9) 5)  Abdominal Pain, Left Lower Quadrant  (ICD-789.04) 6)  Abdominal Pain Other Specified Site  (ICD-789.09) 7)  Long Qt Syndrome  (ICD-426.82) 8)  Dizziness  (ICD-780.4) 9)  Ibs  (ICD-564.1) 10)  Hyperlipidemia  (ICD-272.4) 11)  Anxiety  (ICD-300.00) 12)  Depression  (ICD-311) 13)  Diabetes Mellitus, Type II  (ICD-250.00) 14)  Hypothyroidism  (ICD-244.9)  Medications Prior to Update: 1)  Sertraline Hcl 100 Mg  Tabs (Sertraline Hcl) .Marland Kitchen.. 1po Qd 2)  Lovastatin 20 Mg  Tabs (Lovastatin) .Marland Kitchen.. 1 By Mouth  Once Daily 3)  Levothyroxine Sodium 125 Mcg Tabs (Levothyroxine Sodium) .Marland Kitchen.. 1po Once Daily 4)  Ecotrin Low Strength 81 Mg  Tbec (Aspirin) .Marland Kitchen.. 1po Qd 5)  Advil 200 Mg  Tabs (Ibuprofen) .Marland Kitchen.. 1 By Mouth Once Daily Prn 6)  Accu-Chek Aviva   Strp (Glucose Blood) .... Use As Directed 7)  Risperdal 3 Mg Tabs (Risperidone) .... Take 1 Tablet By Mouth Two Times A Day 8)  Azithromycin 250 Mg Tabs (Azithromycin) .... 2po Qd For 1 Day, Then 1po Qd For 4days, Then Stop  Current Medications (verified): 1)  Sertraline Hcl 100 Mg  Tabs (Sertraline Hcl) .Marland Kitchen.. 1po Qd 2)  Lovastatin 20 Mg  Tabs (Lovastatin) .Marland Kitchen.. 1 By Mouth Once Daily 3)  Levothyroxine Sodium 125 Mcg Tabs (Levothyroxine Sodium) .Marland Kitchen.. 1po Once Daily 4)  Ecotrin Low Strength 81 Mg  Tbec (Aspirin) .Marland Kitchen.. 1po Qd 5)  Advil 200 Mg  Tabs (Ibuprofen) .Marland Kitchen.. 1 By Mouth Once Daily Prn 6)  Accu-Chek Aviva   Strp (Glucose Blood) .... Use As Directed 7)  Risperdal 3 Mg Tabs (Risperidone) .... Take 1 Tablet By Mouth Two Times A Day 8)  Azithromycin 250 Mg Tabs (Azithromycin) .... 2po Qd For 1 Day, Then 1po Qd For 4days, Then Stop  Allergies (verified): 1)  ! Prozac 2)  ! 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   Head:  normocephalic and atraumatic.   Eyes:  vision grossly intact, pupils equal, and pupils round.     Ears:  lefrt tm mild erythema, right tm ok, sinus nontender Nose:  nasal dischargemucosal pallor and mucosal erythema.   Mouth:  pharyngeal erythema and fair dentition.   Neck:  supple and no masses.   Lungs:  normal respiratory effort and normal breath sounds.   Heart:  normal rate and regular rhythm.   Abdomen:  soft, non-tender, and normal bowel sounds.   Extremities:  no edema, no erythema  Neurologic:  alert & oriented X3, strength normal in all extremities, and finger-to-nose normal.     Impression & Recommendations:  Problem # 1:  HYPOTHYROIDISM (ICD-244.9)  Her updated medication list for this problem includes:    Levothyroxine Sodium 125 Mcg Tabs (Levothyroxine sodium) .Marland Kitchen... 1po once daily  Orders: TLB-TSH (Thyroid Stimulating Hormone) (84443-TSH)  Labs Reviewed: TSH: 0.38 (07/30/2009)    HgBA1c: 6.4 (07/02/2009) Chol: 159 (07/02/2009)   HDL: 49.10 (07/02/2009)   LDL: 99 (07/02/2009)   TG: 54.0 (07/02/2009) to re-check tsh.after med adjusted after last visit, but doubt this to be etiology of he acute dizziness  Problem # 2:  DIZZINESS (ICD-780.4) I think c/w vertigo, but she defers trial of meclizine for now; I suspect may be due to left inner ear etilogy but she is not convinced  Problem # 3:  DIABETES MELLITUS, TYPE II (ICD-250.00)  Her updated medication list for this problem includes:    Ecotrin Low Strength 81 Mg Tbec (Aspirin) .Marland Kitchen... 1po qd  Labs Reviewed: Creat: 0.6 (07/02/2009)    Reviewed HgBA1c results: 6.4 (07/02/2009)  6.3 (07/31/2008) stable overall by hx and exam, ok to continue meds/tx as is - no OHA needed at this time  Complete Medication List: 1)  Sertraline Hcl 100 Mg Tabs (Sertraline hcl) .Marland Kitchen.. 1po qd 2)  Lovastatin 20 Mg Tabs (Lovastatin) .Marland Kitchen.. 1 by mouth once daily 3)  Levothyroxine Sodium 125 Mcg Tabs (Levothyroxine sodium) .Marland Kitchen.. 1po once daily 4)  Ecotrin Low Strength 81 Mg Tbec (Aspirin) .Marland Kitchen.. 1po qd 5)  Advil 200 Mg Tabs (Ibuprofen) .Marland Kitchen.. 1  by mouth once daily prn 6)  Accu-chek Aviva Strp (Glucose blood) .... Use as directed 7)  Risperdal 3 Mg Tabs (Risperidone) .... Take 1 tablet by mouth two times a day 8)  Azithromycin 250 Mg Tabs (Azithromycin) .... 2po qd for 1 day, then 1po qd for 4days, then stop  Patient Instructions: 1)  Please go to the Lab in the basement for your blood  tests today for the thyroid 2)  OK to hold the thyroid medication for now; but if the blood test is normal, you can re-start the medication at the same dose 3)  If the blood test is not normal, we will have to  change the dose 4)  Continue all previous medications as before this visit  5)  Please schedule a follow-up appointment as needed.

## 2010-04-13 ENCOUNTER — Encounter: Payer: Self-pay | Admitting: Internal Medicine

## 2010-04-13 ENCOUNTER — Ambulatory Visit (INDEPENDENT_AMBULATORY_CARE_PROVIDER_SITE_OTHER): Payer: Self-pay | Admitting: Internal Medicine

## 2010-04-13 DIAGNOSIS — J029 Acute pharyngitis, unspecified: Secondary | ICD-10-CM

## 2010-04-13 DIAGNOSIS — R030 Elevated blood-pressure reading, without diagnosis of hypertension: Secondary | ICD-10-CM | POA: Insufficient documentation

## 2010-04-13 DIAGNOSIS — E039 Hypothyroidism, unspecified: Secondary | ICD-10-CM

## 2010-04-13 DIAGNOSIS — R42 Dizziness and giddiness: Secondary | ICD-10-CM

## 2010-04-19 NOTE — Assessment & Plan Note (Signed)
Summary: blood pressure issues/cd   Vital Signs:  Patient profile:   60 year old female Height:      61 inches Weight:      111.25 pounds BMI:     21.10 O2 Sat:      96 % on Room air Temp:     98.4 degrees F oral Pulse rate:   74 / minute BP sitting:   122 / 78  (left arm) Cuff size:   regular  Vitals Entered By: Zella Ball Ewing CMA Duncan Dull) (April 13, 2010 2:58 PM)  O2 Flow:  Room air CC: BP elevated/RE   Primary Care Provider:  Corwin Levins MD  CC:  BP elevated/RE.  History of Present Illness: here with onset mild URI symptoms , now with worsening ST but no cough and Pt denies CP, worsening sob, doe, wheezing, orthopnea, pnd, worsening LE edema, palps,  or syncope.  Pt denies new neuro symptoms such as headache, facial or extremity weakness  Pt denies polydipsia, polyuria  Overall good compliance with meds, trying to follow low chol diet, wt stable, little excercise however  Overall good compliance with meds, and good tolerability.  Denies worsening depressive symptoms, suicidal ideation, or panic and keeps up regular f/u with psychiatry.  Did have elev BP 3 days ago with onset of symptoms, became concerned and called EMS with BP since then more controlled.  Has recurrent dizziness, with some lightheadedness, but taking by mouth well, and has ongoing symtpoms for many months.   Preventive Screening-Counseling & Management      Drug Use:  no.    Problems Prior to Update: 1)  Elevated Blood Pressure Without Diagnosis of Hypertension  (ICD-796.2) 2)  Pharyngitis-acute  (ICD-462) 3)  Dizziness  (ICD-780.4) 4)  Leukocytosis  (ICD-288.60) 5)  Sinusitis- Acute-nos  (ICD-461.9) 6)  Abdominal Pain, Left Lower Quadrant  (ICD-789.04) 7)  Abdominal Pain Other Specified Site  (ICD-789.09) 8)  Long Qt Syndrome  (ICD-426.82) 9)  Dizziness  (ICD-780.4) 10)  Ibs  (ICD-564.1) 11)  Hyperlipidemia  (ICD-272.4) 12)  Anxiety  (ICD-300.00) 13)  Depression  (ICD-311) 14)  Diabetes Mellitus, Type II   (ICD-250.00) 15)  Hypothyroidism  (ICD-244.9)  Medications Prior to Update: 1)  Sertraline Hcl 100 Mg  Tabs (Sertraline Hcl) .Marland Kitchen.. 1po Qd 2)  Lovastatin 20 Mg  Tabs (Lovastatin) .Marland Kitchen.. 1 By Mouth Once Daily 3)  Ecotrin Low Strength 81 Mg  Tbec (Aspirin) .Marland Kitchen.. 1po Qd 4)  Advil 200 Mg  Tabs (Ibuprofen) .Marland Kitchen.. 1 By Mouth Once Daily Prn 5)  Accu-Chek Aviva   Strp (Glucose Blood) .... Use As Directed 6)  Risperdal 3 Mg Tabs (Risperidone) .... Take 1 Tablet By Mouth Two Times A Day 7)  Levothroid 125 Mcg Tabs (Levothyroxine Sodium) .Marland Kitchen.. 1 By Mouth Once Daily  Current Medications (verified): 1)  Sertraline Hcl 100 Mg  Tabs (Sertraline Hcl) .Marland Kitchen.. 1po Qd 2)  Lovastatin 20 Mg  Tabs (Lovastatin) .Marland Kitchen.. 1 By Mouth Once Daily 3)  Ecotrin Low Strength 81 Mg  Tbec (Aspirin) .Marland Kitchen.. 1po Qd 4)  Advil 200 Mg  Tabs (Ibuprofen) .Marland Kitchen.. 1 By Mouth Once Daily Prn 5)  Accu-Chek Aviva   Strp (Glucose Blood) .... Use As Directed 6)  Risperdal 3 Mg Tabs (Risperidone) .... Take 1 Tablet By Mouth Two Times A Day 7)  Levothroid 125 Mcg Tabs (Levothyroxine Sodium) .Marland Kitchen.. 1 By Mouth Once Daily 8)  Doxycycline Hyclate 100 Mg Caps (Doxycycline Hyclate) .Marland Kitchen.. 1 By Mouth Two Times A Day  Allergies (verified): 1)  ! Prozac 2)  ! 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   Head:  normocephalic and atraumatic.   Eyes:  vision grossly intact, pupils equal, and pupils round.   Ears:  bilat tm's mild erythema, sinus nontender Nose:  nasal dischargemucosal pallor and mucosal edema.   Mouth:  pharyngeal erythema and fair dentition.   Neck:  supple and cervical lymphadenopathy.   Lungs:  normal respiratory effort and normal breath sounds.   Heart:  normal rate and regular rhythm.   Extremities:  no edema, no erythema  Skin:  color normal and no rashes.   Psych:  not depressed appearing and slightly anxious.     Impression & Recommendations:  Problem # 1:  PHARYNGITIS-ACUTE (ICD-462)  Her updated medication list for this problem includes:    Ecotrin Low Strength 81 Mg Tbec (Aspirin) .Marland Kitchen... 1po qd    Advil 200 Mg Tabs (Ibuprofen) .Marland Kitchen... 1 by mouth once daily prn    Doxycycline Hyclate 100 Mg Caps (Doxycycline hyclate) .Marland Kitchen... 1 by mouth two times a day treat as above, f/u any worsening signs or symptoms   Instructed to complete antibiotics and call if not improved in 48 hours.   Problem # 2:  DIZZINESS (ICD-780.4)  ? related to anxiety and hyperventilation; exam o/w ok for now, declines further w/u at this time as she has no insurance, such as labs, ecg, echo, cxr   Problem # 3:  ELEVATED BLOOD PRESSURE WITHOUT DIAGNOSIS OF HYPERTENSION (ICD-796.2) improved,mild elev today, likely situational, ok to follow, continue same treatment   Problem # 4:  HYPOTHYROIDISM (ICD-244.9)  Her updated medication list for this problem includes:    Levothroid 125 Mcg Tabs (Levothyroxine sodium) .Marland Kitchen... 1 by mouth once daily  Labs Reviewed: TSH: 0.60 (09/08/2009)    HgBA1c: 6.4 (07/02/2009) Chol: 159 (07/02/2009)   HDL: 49.10 (07/02/2009)   LDL: 99 (07/02/2009)   TG: 54.0 (07/02/2009) d/w pt - delcines further labs at this time  Complete Medication  List: 1)  Sertraline Hcl 100 Mg Tabs (Sertraline hcl) .Marland Kitchen.. 1po qd 2)  Lovastatin 20 Mg Tabs (Lovastatin) .Marland Kitchen.. 1 by mouth once daily 3)  Ecotrin Low Strength 81 Mg Tbec (Aspirin) .Marland Kitchen.. 1po qd 4)  Advil 200 Mg Tabs (Ibuprofen) .Marland Kitchen.. 1 by mouth once daily prn 5)  Accu-chek Aviva Strp (Glucose blood) .... Use as directed 6)  Risperdal 3 Mg Tabs (Risperidone) .... Take 1 tablet by mouth two times a day 7)  Levothroid 125 Mcg Tabs (Levothyroxine sodium) .Marland Kitchen.. 1 by mouth once daily 8)  Doxycycline Hyclate 100 Mg Caps (Doxycycline hyclate) .Marland Kitchen.. 1 by mouth two times a day  Patient Instructions: 1)  Please take all new medications as prescribed 2)  Continue all previous medications as  before this visit  3)  Please schedule a follow-up appointment in 6 months, or sooner if needed Prescriptions: DOXYCYCLINE HYCLATE 100 MG CAPS (DOXYCYCLINE HYCLATE) 1 by mouth two times a day  #20 x 0   Entered and Authorized by:   Corwin Levins MD   Signed by:   Corwin Levins MD on 04/13/2010   Method used:   Print then Give to Patient   RxID:   [id number]    Orders Added: 1)  Est. Patient Level IV [14782]

## 2010-05-05 ENCOUNTER — Encounter: Payer: Self-pay | Admitting: Internal Medicine

## 2010-05-06 ENCOUNTER — Ambulatory Visit (INDEPENDENT_AMBULATORY_CARE_PROVIDER_SITE_OTHER): Payer: Self-pay | Admitting: Family Medicine

## 2010-05-06 ENCOUNTER — Encounter: Payer: Self-pay | Admitting: Family Medicine

## 2010-05-06 VITALS — BP 112/78 | HR 83 | Temp 98.0°F | Ht 61.0 in | Wt 109.1 lb

## 2010-05-06 DIAGNOSIS — R030 Elevated blood-pressure reading, without diagnosis of hypertension: Secondary | ICD-10-CM

## 2010-05-06 DIAGNOSIS — J209 Acute bronchitis, unspecified: Secondary | ICD-10-CM

## 2010-05-06 DIAGNOSIS — E119 Type 2 diabetes mellitus without complications: Secondary | ICD-10-CM

## 2010-05-06 MED ORDER — AZITHROMYCIN 250 MG PO TABS: ORAL_TABLET | ORAL | Status: AC

## 2010-05-06 NOTE — Patient Instructions (Signed)
Acute Bronchitis Bronchitis is a problem of the air tubes leading to your lungs. Acute means the illness started quickly. In this condition, the lining of those tubes becomes puffy (swollen) and can leak fluid. This makes it harder for air to get in and out of your lungs. You may cough a lot. This is because the air tubes are narrow. Bronchitis is most often caused by a virus. Medicines that kill germs (antibiotics) may be needed with germ (bacteria) infections for people who:  Smoke.   Have lasting (chronic) lung problems.   Are elderly.  HOME CARE  Rest.   Drink enough water and fluids to keep the pee clear or pale yellow.   Only take medicine as told by your doctor.   Medicines may be prescribed that will open up the airways. This will help make breathing easier.   Bronchitis usually gets better on its own in a few days.  Recovery from some problems (symptoms) of bronchitis may be slow. You should start feeling a little better after 2 to 3 days. Coughing may last for 3 to 4 weeks. GET HELP RIGHT AWAY IF:  You or your child has a temperature by mouth above 143f, not controlled by medicine.   Chills or chest pain develops.   You or your child develops very bad shortness of breath.   There is bloody saliva mixed with mucus (sputum).   You or your child throws up (vomits) often, loses too much fluid (dehydration), feels faint, or has a very bad headache.   You or your child does not improve after 1 week of treatment.  MAKE SURE YOU:   Understand these instructions.   Will watch this condition.   Will get help right away if you or your child is not doing well or gets worse.  Document Released: 07/05/2007 Document Re-Released: 04/12/2009 [facility] Patient Information 2011 [place], [place].

## 2010-05-07 ENCOUNTER — Encounter: Payer: Self-pay | Admitting: Family Medicine

## 2010-05-07 DIAGNOSIS — J209 Acute bronchitis, unspecified: Secondary | ICD-10-CM

## 2010-05-07 HISTORY — DX: Acute bronchitis, unspecified: J20.9

## 2010-05-07 NOTE — Progress Notes (Signed)
  Subjective:    Patient ID: [name], female    DOB: Sep 26, 1950, 59 y.o.   MRN: [id number]  HPI Patient is in today for evaluation of multiple symptoms including fatigue, malaise,cough, midl generalized HA, sore throat, myalgias and nasal congesiton. Many symptoms have been present for over a month but the cough and congestion have worsened recently. She is a smoker and continues to smoke a PPD. No CP/palp/SOB/GI or GU c/o.   Review of Systems  Constitutional: Positive for activity change. Negative for fever, chills and appetite change.  HENT: Positive for congestion. Negative for ear pain, nosebleeds, sneezing, neck pain and postnasal drip.   Respiratory: Positive for cough. Negative for chest tightness and shortness of breath.   Cardiovascular: Negative for chest pain, palpitations and leg swelling.  Gastrointestinal: Negative for abdominal pain.  Musculoskeletal: Positive for myalgias. Negative for back pain.  Neurological: Positive for headaches. Negative for syncope.  Psychiatric/Behavioral: Negative for agitation. The patient is not nervous/anxious.        Objective:   Physical Exam  Constitutional: She is oriented to person, place, and time. She appears well-developed and well-nourished. No distress.  HENT:  Head: Normocephalic and atraumatic.  Right Ear: External ear normal.  Left Ear: External ear normal.  Nose: Nose normal.       Oropharynx erythematous not edematous  Eyes: Conjunctivae are normal.  Neck: Normal range of motion.  Cardiovascular: Normal rate, regular rhythm and normal heart sounds.   No murmur heard. Pulmonary/Chest: Effort normal. No respiratory distress. She has no wheezes. She has no rales.       RLL rhonchi, all else clear to auscultation  Abdominal: She exhibits no mass.  Musculoskeletal: She exhibits no edema.  Lymphadenopathy:    She has no cervical adenopathy.  Neurological: She is alert and oriented to person, place, and time.  Skin:  Skin is warm. She is not diaphoretic.  Psychiatric: She has a normal mood and affect.          Assessment & Plan:  ELEVATED BLOOD PRESSURE WITHOUT DIAGNOSIS OF HYPERTENSION Good control, avoid sodium  DIABETES MELLITUS, TYPE II Patient denies any polyuria or polydipsia  Acute bronchitis Patient with rll rhonchi, will rx with Azithromycina and patient will follow up withpmd if symptoms do not improve.

## 2010-05-07 NOTE — Assessment & Plan Note (Signed)
Patient denies any polyuria or polydipsia

## 2010-05-07 NOTE — Assessment & Plan Note (Signed)
Good control, avoid sodium

## 2010-05-07 NOTE — Assessment & Plan Note (Signed)
Patient with rll rhonchi, will rx with Azithromycina and patient will follow up withpmd if symptoms do not improve.

## 2010-05-16 LAB — DIFFERENTIAL
Basophils Relative: 0 % (ref 0–1)
Lymphs Abs: 2.6 10*3/uL (ref 0.7–4.0)
Monocytes Absolute: 0.5 10*3/uL (ref 0.1–1.0)
Monocytes Relative: 6 % (ref 3–12)
Neutro Abs: 6.6 10*3/uL (ref 1.7–7.7)

## 2010-05-16 LAB — URINALYSIS, ROUTINE W REFLEX MICROSCOPIC
Bilirubin Urine: NEGATIVE
Hgb urine dipstick: NEGATIVE
Nitrite: NEGATIVE
Specific Gravity, Urine: 1.007 (ref 1.005–1.030)
pH: 6 (ref 5.0–8.0)

## 2010-05-16 LAB — POCT I-STAT, CHEM 8
BUN: 15 mg/dL (ref 6–23)
Calcium, Ion: 1.2 mmol/L (ref 1.12–1.32)
Chloride: 97 mEq/L (ref 96–112)
Creatinine, Ser: 0.7 mg/dL (ref 0.4–1.2)
Glucose, Bld: 97 mg/dL (ref 70–99)
HCT: 46 % (ref 36.0–46.0)

## 2010-05-16 LAB — CBC
Hemoglobin: 14.2 g/dL (ref 12.0–15.0)
MCHC: 34 g/dL (ref 30.0–36.0)
RBC: 4.25 MIL/uL (ref 3.87–5.11)
WBC: 9.7 10*3/uL (ref 4.0–10.5)

## 2010-05-16 LAB — POCT CARDIAC MARKERS
CKMB, poc: 3.2 ng/mL (ref 1.0–8.0)
Troponin i, poc: <0.05 ng/mL (ref 0.00–0.09)

## 2010-06-14 NOTE — H&P (Signed)
[name]               ACCOUNT NO.:  [id number]   MEDICAL RECORD NO.:  [id number]          PATIENT TYPE:  INP   LOCATION:  1616                         FACILITY:  [facility]   PHYSICIAN:  Anselm Pancoast. Weatherly, M.D.DATE OF BIRTH:  1950-05-31   DATE OF ADMISSION:  11/01/2006  DATE OF DISCHARGE:                              HISTORY & PHYSICAL   CHIEF COMPLAINT:  Severe abdominal pain.   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  She works for [employer] in [place] and then started having  lower abdominal pain, was then taken to the [facility], worked  up, found to have a white count, and was sent over for a CT which was  completed at approximately 1 a.m. that shows a markedly inflamed  appendix.  She was then advised to come on over to the emergency room  and I was called at approximately 1:30 a.m.   She is on chronic medications for psychiatric.  She is on:  1. Zoloft, probably 20 mg a day.  2. Geodon 40 mg b.i.d.  3. Lovastatin for cholesterol.  4. Synthroid, she does not know the dosage.   PREVIOUS SURGERIES:  1. 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  2. She has had a right knee surgery.  3. She had a tonsillectomy as a child.   She lists allergies to AVELOX.   FAMILY HISTORY:  The patient is widowed.  She did not reveal the details  of her husband's death.  She says it is, a long story.   PHYSICAL EXAMINATION:  GENERAL:  She is a middle-aged female appears  slightly older than her stated age.  ABDOMEN:  She has definitely got a tender abdomen in the right lower  quadrant.  Tender abdomen, right lower quadrant with __________ guarding  and rebound.  It has not really __________ generalized.  I would not be  surprised if she has got an early ruptured appendicitis.  VITAL SIGNS:  Temperature 99.5.  Pulse 89.  Respirations 20.  Blood  pressure 129/87.  She weighs 110  pounds.  CHEST:  Clear.  BREASTS:  Not examined.  CARDIAC:  Normal sinus rhythm.  PELVIC:  Not done.   CT scan revealed a very swollen appendix.   ASSESSMENT:  Acute appendicitis.   We will order 3 grams of Unasyn STAT.  She needs an EKG and a chest x-  ray.  We will plan on an open appendectomy as soon as possible.           ______________________________  Anselm Pancoast. Zachery Dakins, M.D.     WJW/MEDQ  D:  11/02/2006  T:  11/02/2006  Job:  [id number]

## 2010-06-14 NOTE — Op Note (Signed)
NAMEJAYLAA, [name]               ACCOUNT NO.:  [id number]   MEDICAL RECORD NO.:  [id number]          PATIENT TYPE:  INP   LOCATION:  1616                         FACILITY:  [facility]   PHYSICIAN:  Anselm Pancoast. Weatherly, M.D.DATE OF BIRTH:  [birth year]   DATE OF PROCEDURE:  11/02/2006  DATE OF DISCHARGE:                               OPERATIVE REPORT   PREOPERATIVE DIAGNOSIS:  Acute appendicitis, probably ruptured.   POSTOPERATIVE DIAGNOSIS:  Acute appendicitis, probably ruptured.  Acute appendicitis, ruptured.   OPERATIONS:  Open appendectomy.   General anesthesia.   SURGEON:  Anselm Pancoast. Zachery Dakins, M.D.   ASSISTANT:  Nurse.   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  She has  had a history of chronic diarrhea but was seen in the urgent care.  Lab  studies showed an elevated white count, lower abdominal tenderness, and  then was referred for a CT and the CT was consistent with acute  appendicitis and she was advised to come to the emergency room.  She  arrived there about 1 a.m.  The ER physician called me.  I was in the  hospital, fortunately.  On physical exam she was definitely tender in  the lower abdomen.  I gave her  3 g of Unisom, permission obtained for  an appendectomy.  Her white count was 20,700.  I think this is probably  a ruptured appendicitis.  I elected to do an open appendectomy.  Fortunately, she is only about 110 pounds.   The patient was taken to the operative suite.  She received the Unasyn  and after induction of general anesthesia, a Foley catheter was  inserted.  The abdomen was prepped with Betadine solution and draped in  a sterile manner.  At the McBurney area a transverse incision was made.  Sharp dissection down through the skin, subcutaneous, Scarpa fascia and  then the external oblique  and the oblique were opened in the direction  of their fibers.  The underlying peritoneum was picked up and opened  into the peritoneal cavity.  She definitely has a ruptured appendicitis  and you could feel this very large appendix kind of medially.  I could  hook it with my finger and then grab it with a Babcock and followed it  out.  You could see where it had ruptured, and the appendiceal mesentery  was divided between [facility].  These were ligated with 2-0 Vicryl right  down to the base of the appendix.  The appendix was crushed, tied with 2-  0 and pursestring suture of 3-0 silk and the stump inverted and tied.  I  cultured the peritoneal fluid where the appendix had been ruptured with  aerobic and anaerobic cultures.  We then thoroughly irrigated the  peritoneal cavity with about 3 L of saline until everything returned  clear, aspirating with saline and kind of looking into the pelvis and  feeling, etc, and I did not and feel any evidence  of any actual  abscesses, and there was no evidence of any abscesses noted on the CT.  We irrigated again, aspirated everything with a pool sucker and then the  omentum was down under the incision.  I then closed the peritoneum with  a running 2-0 Vicryl, the internal oblique was then closed with the  running 2-0 Vicryl, and the external oblique was closed with interrupted  2-0 Vicryl irrigating between layers,  and then a place two 4-0 Vicryl in Scarpa fascia and closed the skin  with Steri-Strips.  We will give Zosyn and Flagyl, watch her incision  carefully and, hopefully, can restart her psychiatric medicines  tomorrow.  Sponge and needle counts were correct x2 and estimated blood  loss was minimal.           ______________________________  Anselm Pancoast. Zachery Dakins, M.D.     WJW/MEDQ  D:  11/02/2006  T:  11/02/2006  Job:  [id number]

## 2010-06-14 NOTE — Letter (Signed)
November 07, 2007    Cynthia Levins, MD  [address]  [place], [place] [address]   RE:  [name]  MRN:  [id number]  /  DOB:  Sep 20, 1950   Dear Dr. Jonny Lozano:   It was my pleasure to see your patient [name] in  electrophysiologic consultation today for a QT prolongation.  As you  aware, she is a very pleasant 60 year old female with history of  diabetes, hypertension, and depression who was recently found to have a  prolonged QT interval.  The patient reports being in good health until  September 11, 2007, when she developed a strange feeling while driving  home from work.  She has difficulty articulating her symptoms.  She  reports that she became gradually nervous and anxious and that her head  felt funny.  She reports feeling mildly sluggish and is though she  might passed out.  These symptoms were very gradual in onset and  termination and lasted approximately 2 hours.  She is very clear that  she had no palpitations or heart racing.  She did not have significant  presyncope or syncope with this episode.  She denies any other syncopal  episodes in the past.  She went home and had dinner and reported  complete resolution in her symptoms which was gradual.  She subsequently  presented to the urgent family medical practice and had an EKG obtained.  This EKG was read to have QT prolongation with a QT of 440 and a QTc of  478 milliseconds.  Upon my review of this EKG, the QT appears to be 410  milliseconds with a QTc of 458 milliseconds.  The patient has previously  been treated with Geodon and was felt that this may have contributed to  her QT prolongation.  The patient reports doing very well since that  time.  She has had no symptoms of palpitations, presyncope, or syncope.  She also denies chest discomfort, shortness of breath, or other  concerns.  She is otherwise without complaint at this time.   PAST MEDICAL HISTORY:  1. Diabetes mellitus.  2. Hyperlipidemia.  3.  Depression with an overdose attempt in 1990s.  4. Status post appendectomy in 2008.  5. Status post tonsillectomy in 1950s.  6. Hypothyroidism.   ALLERGIES:  PROZAC and an unknown antibiotic.   MEDICATIONS:  1. Zoloft 100 mg daily.  2. Geodon 40 mg b.i.d.  3. Lovastatin 20 mg daily.  4. Levothyroxine 150 mcg daily.  5. Aspirin 81 mg daily.   SOCIAL HISTORY:  The patient lives in [place].  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  She smokes one-half pack per day and  denies alcohol or drug use.   FAMILY HISTORY:  The patient's mother died of heart problems at age 80  that she did not died suddenly.  The patient had a prior heart  surgery, but did not have sudden death.  She is unaware of any family  history of prolonged QT or sudden death.   REVIEW OF SYSTEMS:  All systems are reviewed and negative except as  outlined in the HPI above.   PHYSICAL EXAMINATION:  VITAL SIGNS:  Blood pressure is 100/70, heart  rate 73, respirations 18, and weight 104 pounds.  GENERAL:  The patient is a thin female in no acute distress.  She is  alert and oriented x3.  HEENT:  Normocephalic and atraumatic.  Sclerae clear.  Conjunctivae  pink.  Oropharynx clear.  NECK:  Supple.  No JVD, lymphadenopathy, or bruits.  LUNGS:  Clear to auscultation bilaterally.  HEART:  Regular rate and rhythm.  No murmurs, rubs or gallops.  GI:  Soft, nontender, and nondistended.  Positive bowel sounds.  EXTREMITIES:  No clubbing, cyanosis or edema.  NEUROLOGIC:  Strength and sensation are intact.  SKIN:  No ecchymosis or lacerations.  MUSCULOSKELETAL:  No deformity or atrophy.  PSYCH:  Euthymic mood.  Full affect.  The patient denies any suicidal or  homicidal tendencies at this time.   LABORATORY DATA:  EKG:  Normal sinus rhythm at 73 beats per minute.  The  QT interval today measures 405 milliseconds with a QTc measuring 453  milliseconds.   IMPRESSION:  [name] is a very pleasant 60 year old  female with a  history of diabetes, hyperlipidemia, tobacco use, and depression.  She  presents today for EP consultation regarding possible prolonged QT  interval on her acute EKG as well as an episode of dizziness which  occurred in mid August.  Upon review of her EKG, I find her QT interval  to be less than 470 milliseconds.  Therefore, did not think that she has  overt prolonged QT.  Her symptom in mid August is not consistent with a  tachyarrhythmia such as torsades, as it is very gradual and prolonged in  onset and termination with no clear palpitations or presyncope.  She has  no prior history of syncope or familial QT prolongation.  Certainly,  treatment with Geodon will tend to prolong the QT interval.  An  echocardiogram has been performed on November 06, 2007, which reveals a  normal left ventricular ejection fraction of 65% with no wall motion  abnormalities.   PLAN:  I think that [name] is at very low risk for symptomatic QT  prolongation or torsade pointe.  I therefore think, that we should  simply follow her course.  I think it is reasonable to continue the  patient on Geodon, if she has received therapeutic benefit for her  depression and is otherwise tolerating this medication without side  effects.  I think it would be reasonable from time-to-time to obtain an  EKG.  Certainly, should the patient develop significant QT prolongation,  the most reasonable course would be to discontinue any QT prolonging  medications at that time.  I have discussed, a list of medications  including antibiotics psychiatric drugs today, which are known to  prolong the QT interval and I have recommended that the patient not to  take additional QT prolonging medications in addition to her present  regimen of Geodon.  I think that the patient found this strategy  reasonable.  Should the patient have a significant QT prolongation of  greater than 400 milliseconds or torsades in the future  then we would  need to readdress and alter our strategy at that time.  I will return  [name] to your care for further evaluation of her medical problems.  I have recommended smoking cessation today, though she appears to be in  a contemplated phase at the present time.  She is aware that she should  contact my office should she require my assistance in the future.  I  appreciate the opportunity to participate in her care.    Sincerely,      Hillis Range, MD  Electronically Signed    JA/MedQ  DD: 11/07/2007  DT: 11/08/2007  Job #: [id number]   CC:    Arturo Morton.  Riley Kill, MD, [facility]

## 2010-06-17 NOTE — H&P (Signed)
[name], [name]               ACCOUNT NO.:  [id number]   MEDICAL RECORD NO.:  [id number]          PATIENT TYPE:  INP   LOCATION:  6735                         FACILITY:  [facility]   PHYSICIAN:  Corwin Levins, M.D. LHCDATE OF BIRTH:  Jun 28, 1950   DATE OF ADMISSION:  08/23/2005  DATE OF DISCHARGE:                                HISTORY & PHYSICAL   CHIEF COMPLAINT:  Worsening shortness of breath over the last 2-3 days.   HISTORY OF PRESENT ILLNESS:  [name] is a 60 year old white female here  with three days of worsening shortness of breath and febrile illness with  cough and bilateral pleuritic chest discomfort.  She is found to be hypoxic  in the office today and is therefore to be admitted for presumed pneumonia.  She was seen at urgent care on Monday, July 23rd and given Avelox but  unfortunately developed a rash.  She was then given doxycycline yesterday  but still getting worse.   PAST MEDICAL HISTORY:  1.  Hypothyroidism.  2.  Hypercholesterolemia.  3.  Anxiety/depression.   PAST SURGICAL HISTORY:  None.   ALLERGIES:  1.  AVELOX caused a rash.  2.  PROZAC causes a rash.   MEDICATIONS:  1.  Zoloft 100 mg p.o. daily.  2.  Geodon 40 mg b.i.d.  3.  Mevacor 20 mg p.o. daily.  4.  Levothyroxine 0.15 mg p.o. daily.   SOCIAL HISTORY:  Smoking one pack per day.  Lives alone but has a friend who  checks on her and brings her in today.  No alcohol.   FAMILY HISTORY:  Otherwise noncontributory.   REVIEW OF SYSTEMS:  Otherwise noncontributory.   PHYSICAL EXAMINATION:  VITAL SIGNS:  Blood pressure 120/80, respirations 20,  pulse 104, temperature 99.9.  Weight 120.  GENERAL:  [name] is a 60 year old white female.  She appears moderately  ill.  HEENT:  Sclerae are clear.  TM's clear.  Pharynx has mild erythema.  NECK:  Without lymphadenopathy, JVD, thyromegaly.  CHEST:  Somewhat decreased breath sounds.  No rales or wheezing.  CARDIAC:  Regular rate and rhythm.  ABDOMEN:  Soft and nontender.  Positive bowel sounds.  EXTREMITIES:  No edema.  NEUROLOGIC:  Alert and oriented x3.  Otherwise nonfocal.   IMPRESSION/PLAN:  1.  Febrile illness with cough, increasing respiratory rate, pleuritic chest      pain, and hypoxia, mostly consistent with pneumonia, community-acquired.      She is to be admitted.  Will start blood cultures, apply chest x-ray and      labs, as well as treat with IV Rocephin and azithromycin IV as well.      Otherwise, follow clinically.  2.  Other medical problems, continue home medications.           ______________________________  Corwin Levins, M.D. [facility]     JWJ/MEDQ  D:  08/23/2005  T:  08/23/2005  Job:  [id number]

## 2010-06-17 NOTE — Op Note (Signed)
[facility]  Patient:    [name], [name] Visit Number: [id number] MRN: [id number]          Service Type: DSU Location: [facility] Attending Physician:  Alinda Deem Dictated by:   Alinda Deem, M.D. Proc. Date: 10/08/00 Admit Date:  10/08/2000                             Operative Report  PREOPERATIVE DIAGNOSIS:  Right knee lateral meniscal tear.  POSTOPERATIVE DIAGNOSIS:  Right knee lateral meniscal tear.  Chondromalacia of the lateral femoral condyle, focal grade 3 and lateral tibial condyle global grade 3.  OPERATION PERFORMED:  Right knee arthroscopic partial lateral meniscectomy and debridement of chondromalacia.  SURGEON:  Alinda Deem, M.D.  ASSISTANT:  Dorthula Matas, P.A.-C.  ANESTHESIA:  General LMA.  ESTIMATED BLOOD LOSS:  Minimal.  FLUID REPLACEMENT:  800 cc of crystalloid.  DRAINS:  None.  TOURNIQUET TIME:  None.  INDICATIONS FOR PROCEDURE:  The patient is a 60 year old woman with symptomatic lateral joint line pain of the right knee for many months.  She has failed conservative treatment with anti-inflammatory medicine, physical therapy and observation.  She has occasional catching and locking and now desires elective arthroscopic evaluation and treatment of her knee.  DESCRIPTION OF PROCEDURE:  The patient was identified by arm band and taken to the operating room at [facility] where the appropriate anesthetic monitors were attached and general LMA anesthesia induced with the patient in the supine position.  A lateral post was applied to the table and the right lower extremity prepped and draped in the usual sterile fashion from the foot to the midthigh.  The inferomedial and inferolateral peripatellar portal regions were then infiltrated with 2 to 3 cc of 0.5% Marcaine with epinephrine solution and using a #11 blade, standard stab wounds were made on the inferomedial and inferolateral  parapatellar regions allowing introduction of the arthroscope through the inferolateral portal and the outflow through the inferomedial portal.  Some grade 2 chondromalacia of the patella and trochlea was noted that did not require debridement and the medial compartment was in excellent condition regarding the articular meniscal cartilages.  The ACL and the PCL were intact.  The patient had a lateral to anterior horn tear of the lateral meniscus which was quite shredded and required debridement with 3.5 mm gator sucker shaver as well as the basket forceps.  The posterior horn of the medial meniscus was in good condition.  The patient had global grade 3 chondromalacia of the lateral tibial condyle which was debrided back to stable margins and focal grade 3 chondromalacia of the lateral femoral condyle over a 3 x 5 mm area on the lateral side of the condyle.  This was also debrided back to stable margins.  Small bits of articular cartilage floating in the joint fluid were also removed with the outflow.  Satisfied with the debridement we then examined the gutters and cleared them and went to medial and lateral to the PCL clearing the posterior compartments as well.  At this point the knee was washed out with normal saline solution.  The arthroscopic instruments were removed.  A dressing of Xeroform, 4 x 4 dressing sponges, Webril and an Ace wrap applied.  The patient was awakened and taken to the recovery room without difficulty. Dictated by:   Alinda Deem, M.D. Attending Physician:  Alinda Deem DD:  10/08/00 TD:  10/08/00 Job: [id number]

## 2010-06-17 NOTE — Discharge Summary (Signed)
NAMEJAZSMINE, [name]               ACCOUNT NO.:  [id number]   MEDICAL RECORD NO.:  [id number]          PATIENT TYPE:  INP   LOCATION:  6735                         FACILITY:  [facility]   PHYSICIAN:  Corwin Levins, M.D. LHCDATE OF BIRTH:  14-Mar-1950   DATE OF ADMISSION:  08/23/2005  DATE OF DISCHARGE:  08/27/2005                                 DISCHARGE SUMMARY   DISCHARGE DIAGNOSES:  1.  Atypical pneumonia.  2.  Chronic obstructive pulmonary disease.  3.  Hypothyroidism.  4.  Hyperlipidemia.  5.  Chronic smoker.  6.  Hyponatremia.  7.  Hypokalemia.  8.  Depression/anxiety.   PROCEDURES PERFORMED:  None.   CONSULTATIONS:  None.   HISTORY OF PRESENT ILLNESS:  Please see history and physical dictated by me  on the date of admission.   HOSPITAL COURSE:  [name] is a 60 year old white female who presented  with three days of worsening shortness of breath with hypoxia and cough.  She was admitted for further evaluation and treatment.  Chest x-ray with  bilateral pneumonitis.  Specifically, abnormal bilateral interstitial  opacities with faint perihilar air space opacities superimposed on COPD.  She responded nicely to the usual treatment including IV antibiotics, O2,  nebulizer treatments, pulmonary toilette.  She became more ambulatory,  appetite resumed, hypoxia resolved; and, by the time of discharge, she was  breathing back to her baseline, with some nonpersistent cough only.  Hypoxia  resolved.   White blood cell count was 9.8 at the time of discharge, hemoglobin 12.7.  There was some transient hypokalemia and hyponatremia, both of which  resolved with parenteral replacement.  There was also a fair amount of  transient hypoglycemia of 127 which resolved as well.  There were no other  further complications during her hospitalization.  She was ambulatory,  eating well.  She was felt to have gained maximum benefit from this  hospitalization and is being discharged home.   Also of note, her TSH was normal during this hospitalization.   DISPOSITION:  Discharge to home in good condition.  There are no activity or  dietary restrictions.   MEDICATIONS ON DISCHARGE:  Discharge medications to include Ceftin 250 mg  p.o. twice per day for five days; azithromycin Z-Pak x1; Histussin HC  p.r.n.; Zoloft 100 mg p.o. per day; Geodon 400 mg p.o. twice per day;  Mevacor 20 mg p.o. per day; levothyroxine 0.15 mg p.o. per day.   FOLLOW-UP:  She will follow up with me, Dr. Jonny Ruiz, in 1-2 weeks.           ______________________________  Corwin Levins, M.D. [facility]     JWJ/MEDQ  D:  08/27/2005  T:  08/27/2005  Job:  [id number]

## 2010-06-29 ENCOUNTER — Telehealth: Payer: Self-pay | Admitting: Internal Medicine

## 2010-06-29 NOTE — Telephone Encounter (Signed)
Ok for cpx labs  To dahlia to handle please

## 2010-06-29 NOTE — Telephone Encounter (Signed)
Pt had labs June 2011, pt requesting labs this Friday for cholesterol ect then f/u appt. Please advise.

## 2010-06-29 NOTE — Telephone Encounter (Signed)
CPX LABS SCHEDULED 06/01, PT ADVISED VIA VM

## 2010-07-01 ENCOUNTER — Other Ambulatory Visit: Payer: Self-pay | Admitting: Internal Medicine

## 2010-07-01 ENCOUNTER — Other Ambulatory Visit (INDEPENDENT_AMBULATORY_CARE_PROVIDER_SITE_OTHER): Payer: Self-pay

## 2010-07-01 ENCOUNTER — Other Ambulatory Visit: Payer: Self-pay

## 2010-07-01 DIAGNOSIS — Z0000 Encounter for general adult medical examination without abnormal findings: Secondary | ICD-10-CM

## 2010-07-01 LAB — CBC WITH DIFFERENTIAL/PLATELET
Basophils Relative: 0.5 % (ref 0.0–3.0)
Eosinophils Absolute: 0 10*3/uL (ref 0.0–0.7)
Lymphocytes Relative: 26.6 % (ref 12.0–46.0)
MCHC: 35 g/dL (ref 30.0–36.0)
MCV: 98 fl (ref 78.0–100.0)
Monocytes Absolute: 0.4 10*3/uL (ref 0.1–1.0)
Neutrophils Relative %: 66.8 % (ref 43.0–77.0)
Platelets: 216 10*3/uL (ref 150.0–400.0)
RBC: 4.24 Mil/uL (ref 3.87–5.11)
WBC: 7.9 10*3/uL (ref 4.5–10.5)

## 2010-07-01 LAB — LIPID PANEL
Cholesterol: 143 mg/dL (ref 0–200)
HDL: 48.6 mg/dL (ref >39.00)
LDL Cholesterol: 82 mg/dL (ref 0–99)
Total CHOL/HDL Ratio: 3
Triglycerides: 63 mg/dL (ref 0.0–149.0)

## 2010-07-01 LAB — HEPATIC FUNCTION PANEL
ALT: 18 U/L (ref 0–35)
Albumin: 3.8 g/dL (ref 3.5–5.2)
Alkaline Phosphatase: 76 U/L (ref 39–117)
Bilirubin, Direct: 0.1 mg/dL (ref 0.0–0.3)
Total Protein: 6.5 g/dL (ref 6.0–8.3)

## 2010-07-01 LAB — BASIC METABOLIC PANEL
CO2: 28 mEq/L (ref 19–32)
Chloride: 103 mEq/L (ref 96–112)
Potassium: 4.1 mEq/L (ref 3.5–5.1)
Sodium: 137 mEq/L (ref 135–145)

## 2010-07-01 LAB — URINALYSIS
Bilirubin Urine: NEGATIVE
Ketones, ur: NEGATIVE
Leukocytes, UA: NEGATIVE
Urine Glucose: NEGATIVE
Urobilinogen, UA: 0.2 (ref 0.0–1.0)

## 2010-07-01 NOTE — Progress Notes (Signed)
Quick Note:  Voice message left on [employer] system - lab is negative, normal or otherwise stable, pt to continue same tx ______ 

## 2010-08-16 ENCOUNTER — Encounter: Payer: Self-pay | Admitting: Internal Medicine

## 2010-08-16 DIAGNOSIS — Z0000 Encounter for general adult medical examination without abnormal findings: Secondary | ICD-10-CM | POA: Insufficient documentation

## 2010-08-18 ENCOUNTER — Telehealth: Payer: Self-pay | Admitting: *Deleted

## 2010-08-18 MED ORDER — LEVOTHYROXINE SODIUM 125 MCG PO TABS: 125.0000 ug | ORAL_TABLET | Freq: Every day | ORAL | Status: DC

## 2010-08-18 NOTE — Telephone Encounter (Signed)
Request for Levothyroxine to [facility]. Rx Done.

## 2010-08-19 ENCOUNTER — Ambulatory Visit: Payer: Self-pay | Admitting: Internal Medicine

## 2010-09-16 ENCOUNTER — Other Ambulatory Visit: Payer: Self-pay | Admitting: Internal Medicine

## 2010-11-10 LAB — CBC
HCT: 35.8 — ABNORMAL LOW
HCT: 42.7
Hemoglobin: 14.7
Platelets: 200
RBC: 4.36
RDW: 13.3
WBC: 9.7

## 2010-11-10 LAB — WOUND CULTURE

## 2010-11-10 LAB — COMPREHENSIVE METABOLIC PANEL
AST: 25
Albumin: 4
Alkaline Phosphatase: 65
CO2: 24
Chloride: 96
Creatinine, Ser: 0.62
GFR calc Af Amer: >60
GFR calc non Af Amer: >60
Potassium: 3 — ABNORMAL LOW
Total Bilirubin: 1

## 2010-11-10 LAB — DIFFERENTIAL
Lymphocytes Relative: 6 — ABNORMAL LOW
Lymphs Abs: 1.3
Monocytes Absolute: 0.5
Monocytes Relative: 2 — ABNORMAL LOW
Neutro Abs: 18.9 — ABNORMAL HIGH

## 2010-11-10 LAB — CREATININE, SERUM
Creatinine, Ser: 0.69
GFR calc Af Amer: >60
GFR calc non Af Amer: >60

## 2010-11-10 LAB — BASIC METABOLIC PANEL
BUN: 2 — ABNORMAL LOW
Calcium: 8.3 — ABNORMAL LOW
Creatinine, Ser: 0.67
GFR calc non Af Amer: >60
Glucose, Bld: 96
Potassium: 3.3 — ABNORMAL LOW

## 2010-11-10 LAB — BUN: BUN: 6

## 2010-11-10 LAB — ANAEROBIC CULTURE

## 2010-11-16 ENCOUNTER — Encounter: Payer: Self-pay | Admitting: Internal Medicine

## 2010-11-16 ENCOUNTER — Ambulatory Visit (INDEPENDENT_AMBULATORY_CARE_PROVIDER_SITE_OTHER): Payer: Self-pay | Admitting: Internal Medicine

## 2010-11-16 VITALS — BP 112/80 | HR 76 | Temp 97.7°F | Ht 62.0 in | Wt 108.1 lb

## 2010-11-16 DIAGNOSIS — E119 Type 2 diabetes mellitus without complications: Secondary | ICD-10-CM

## 2010-11-16 DIAGNOSIS — E039 Hypothyroidism, unspecified: Secondary | ICD-10-CM

## 2010-11-16 DIAGNOSIS — M5430 Sciatica, unspecified side: Secondary | ICD-10-CM

## 2010-11-16 DIAGNOSIS — M5432 Sciatica, left side: Secondary | ICD-10-CM | POA: Insufficient documentation

## 2010-11-16 MED ORDER — GABAPENTIN 300 MG PO CAPS: 300.0000 mg | ORAL_CAPSULE | Freq: Three times a day (TID) | ORAL | Status: DC

## 2010-11-16 NOTE — Patient Instructions (Signed)
Take all new medications as prescribed Start the gabapentin at one pill at night for 2 nights, then twice per day for 2 days, then three times per day after that Please call in 1-2 wks if you feel the medication helps, but might need higher strength of the medication Continue all other medications as before Call if you would like the MRI ordered, and/or see the Neurosurgeon at [facility] in [facility]

## 2010-11-16 NOTE — Progress Notes (Signed)
Subjective:    Patient ID: [name], female    DOB: September 01, 1950, 60 y.o.   MRN: [id number]  HPI  Here to f/u; June 2012 labs reviewed with pt;  Pt continues to have recurring left LBP with some change in severity in that it seems more constant now, bowel or bladder change, fever, wt loss,  worsening LE numbness/weakness, gait change or falls; does have radiation to the buttocks and post left leg, sometime flare of pain also to the left groin area;  Not worse with standing, but standing and walking make worse within just a minute with limping due to pain, but not numb or weak.  No falls.  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 Advil and ibuprofen not really working now, though does not take all the time. Pt denies chest pain, increased sob or doe, wheezing, orthopnea, PND, increased LE swelling, palpitations, dizziness or syncope.   Pt denies polydipsia, polyuria.  Pt states overall good compliance with meds, trying to follow lower cholesterol diet, wt overall stable.  Denies hyper or hypo thyroid symptoms such as voice, skin or hair change. Past Medical History  Diagnosis Date  . Hypothyroidism   . Diabetes mellitus     type 2- diet  . Depression   . Anxiety   . Hyperlipidemia   . Pneumonia 7-07  . Suicide attempt   . 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 DIABETES MELLITUS, TYPE II 02/15/2007  . Acute bronchitis 05/07/2010   Past Surgical History  Procedure Date  . Tonsillectomy   . Appendectomy 2007  . S/p right knee surgery     reports that she has been smoking.  She has never used smokeless tobacco. She reports that she does not drink alcohol or use illicit drugs. family history includes Alcohol abuse in her father, mother, and other; Cancer in her other; Heart disease in her mother; and Other in her father. Allergies  Allergen Reactions  . Fluoxetine Hcl    REACTION: rash  . Moxifloxacin     REACTION: rash   Current Outpatient Prescriptions on File Prior to Visit  Medication Sig Dispense Refill  . ACCU-CHEK AVIVA test strip USE AS DIRECTED  100 each  1  . aspirin 81 MG EC tablet Take 81 mg by mouth daily.       Marland Kitchen ibuprofen (ADVIL,MOTRIN) 200 MG tablet Take 200 mg by mouth daily as needed.        Marland Kitchen levothyroxine (SYNTHROID, LEVOTHROID) 125 MCG tablet Take 1 tablet (125 mcg total) by mouth daily.  30 tablet  5  . lovastatin (MEVACOR) 20 MG tablet TAKE ONE TABLET BY MOUTH EVERY DAY  30 tablet  11   Review of Systems Review of Systems  Constitutional: Negative for diaphoresis and unexpected weight change.  HENT: Negative for drooling and tinnitus.   Eyes: Negative for photophobia and visual disturbance.  Respiratory: Negative for choking and stridor.   Gastrointestinal: Negative for vomiting and blood in stool.  Genitourinary: Negative for hematuria and decreased urine volume.      Objective:   Physical Exam BP 112/80  Pulse 76  Temp(Src) 97.7 F (36.5 C) (Oral)  Ht 5\' 2"  (1.575 m)  Wt 108 lb 1.9 oz (49.043 kg)  BMI 19.78 kg/m2  SpO2 98% Physical Exam  VS noted Constitutional: Pt appears well-developed and well-nourished.  HENT: Head:  Normocephalic.  Right Ear: External ear normal.  Left Ear: External ear normal.  Eyes: Conjunctivae and EOM are normal. Pupils are equal, round, and reactive to light.  Neck: Normal range of motion. Neck supple.  Cardiovascular: Normal rate and regular rhythm.   Pulmonary/Chest: Effort normal and breath sounds normal.  Abd:  Soft, NT, non-distended, + BS Neurological: Pt is alert. No cranial nerve deficit. motor/sens/dtr/gait intact Skin: Skin is warm. No erythema.  Psychiatric: Pt behavior is normal.1+ nervous Assessment & Plan:

## 2010-11-17 ENCOUNTER — Encounter: Payer: Self-pay | Admitting: Internal Medicine

## 2010-11-17 NOTE — Assessment & Plan Note (Signed)
stable overall by hx and exam, most recent data reviewed with pt, and pt to continue medical treatment as before  Lab Results  Component Value Date   TSH 2.22 07/01/2010

## 2010-11-17 NOTE — Assessment & Plan Note (Signed)
Lab Results  Component Value Date   HGBA1C 6.4 07/02/2009   stable overall by hx and exam, most recent data reviewed with pt, and pt to continue medical treatment as before, delcines further labs today

## 2010-11-30 ENCOUNTER — Encounter: Payer: Self-pay | Admitting: Internal Medicine

## 2010-11-30 ENCOUNTER — Ambulatory Visit (INDEPENDENT_AMBULATORY_CARE_PROVIDER_SITE_OTHER): Payer: Self-pay | Admitting: Internal Medicine

## 2010-11-30 VITALS — BP 100/68 | HR 75 | Temp 98.6°F | Ht 61.0 in | Wt 111.0 lb

## 2010-11-30 DIAGNOSIS — G4700 Insomnia, unspecified: Secondary | ICD-10-CM

## 2010-11-30 DIAGNOSIS — J209 Acute bronchitis, unspecified: Secondary | ICD-10-CM

## 2010-11-30 MED ORDER — TEMAZEPAM 30 MG PO CAPS: 30.0000 mg | ORAL_CAPSULE | Freq: Every evening | ORAL | Status: DC | PRN

## 2010-11-30 MED ORDER — PREDNISONE 10 MG PO TABS: 10.0000 mg | ORAL_TABLET | Freq: Every day | ORAL | Status: AC

## 2010-11-30 MED ORDER — CEPHALEXIN 500 MG PO CAPS: 500.0000 mg | ORAL_CAPSULE | Freq: Four times a day (QID) | ORAL | Status: AC

## 2010-11-30 MED ORDER — BENZONATATE 100 MG PO CAPS: ORAL_CAPSULE | ORAL | Status: DC

## 2010-11-30 NOTE — Patient Instructions (Addendum)
Take all new medications as prescribed (the antibiotic and cough pills were sent to the pharmacy, and the prednisone and the cough medicine are given hardcopy) Continue all other medications as before Please return in 6 months, or sooner if needed

## 2010-11-30 NOTE — Assessment & Plan Note (Signed)
Mild to mod, for antibx course,  to f/u any worsening symptoms or concerns, also for tess perle, and also with few exp wheezez will give low dose prednisone if wheeze/sob worsening, though not clear she needs to take at this time

## 2010-12-01 ENCOUNTER — Encounter: Payer: Self-pay | Admitting: Internal Medicine

## 2010-12-01 NOTE — Assessment & Plan Note (Signed)
Mild to mod, for ambien prn,  to f/u any worsening symptoms or concerns  

## 2010-12-01 NOTE — Progress Notes (Signed)
  Subjective:    Patient ID: Carlynn Herald, female    DOB: [birth year], 60 y.o.   MRN: [id number]  HPI  Here with acute onset mild to mod 2-3 days ST, HA, general weakness and malaise, with prod cough greenish sputum, but Pt denies chest pain, increased sob or doe, wheezing, orthopnea, PND, increased LE swelling, palpitations, dizziness or syncope.  Also with ongoing insomnia, with diffictuly getting to sleep and staying asleep,  Has had some increased depressive symtpoms with recent med changes per psychiatry. Past Medical History  Diagnosis Date  . Hypothyroidism   . Diabetes mellitus     type 2- diet  . Depression   . Anxiety   . Hyperlipidemia   . Pneumonia 7-07  . Suicide attempt   . 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 DIABETES MELLITUS, TYPE II 02/15/2007  . Acute bronchitis 05/07/2010   Past Surgical History  Procedure Date  . Tonsillectomy   . Appendectomy 2007  . S/p right knee surgery     reports that she has been smoking.  She has never used smokeless tobacco. She reports that she does not drink alcohol or use illicit drugs. family history includes Alcohol abuse in her father, mother, and other; Cancer in her other; Heart disease in her mother; and Other in her father. Allergies  Allergen Reactions  . Fluoxetine Hcl     REACTION: rash  . Moxifloxacin     REACTION: rash   Current Outpatient Prescriptions on File Prior to Visit  Medication Sig Dispense Refill  . ACCU-CHEK AVIVA test strip USE AS DIRECTED  100 each  1  . aspirin 81 MG EC tablet Take 81 mg by mouth daily.       Marland Kitchen gabapentin (NEURONTIN) 300 MG capsule Take 1 capsule (300 mg total) by mouth 3 (three) times daily.  90 capsule  5  . ibuprofen (ADVIL,MOTRIN) 200 MG tablet Take 200 mg by mouth daily as needed.        Marland Kitchen levothyroxine (SYNTHROID, LEVOTHROID) 125 MCG tablet Take 1 tablet (125 mcg total) by mouth daily.  30 tablet  5  . lovastatin (MEVACOR) 20 MG tablet TAKE ONE TABLET BY MOUTH EVERY  DAY  30 tablet  11   Review of Systems All otherwise neg per pt     Objective:   Physical Exam BP 100/68  Pulse 75  Temp(Src) 98.6 F (37 C) (Oral)  Ht 5\' 1"  (1.549 m)  Wt 111 lb (50.349 kg)  BMI 20.97 kg/m2  SpO2 96% Physical Exam  VS noted, mild ill Constitutional: Pt appears well-developed and well-nourished.  HENT: Head: Normocephalic.  Right Ear: External ear normal.  Left Ear: External ear normal.  Bilat tm's mild erythema.  Sinus nontender.  Pharynx mild erythema Eyes: Conjunctivae and EOM are normal. Pupils are equal, round, and reactive to light.  Neck: Normal range of motion. Neck supple.  Cardiovascular: Normal rate and regular rhythm.   Pulmonary/Chest: Effort normal and breath sounds normal.  LE:  No edema    Assessment & Plan:

## 2010-12-12 ENCOUNTER — Telehealth: Payer: Self-pay

## 2010-12-12 NOTE — Telephone Encounter (Signed)
Pt called stating that after taking Gabapentin her blood pressure spiked to 140/85 and she stopped taking it. Once she realized that this was only a mild elevation she decided to re-start medication but is unsure if needs to titrate up as she originally did, please advise.

## 2010-12-12 NOTE — Telephone Encounter (Signed)
Patient informed of MD's instructions

## 2010-12-12 NOTE — Telephone Encounter (Signed)
Gabapentin does not affect BP  - ok to take, and continue to monitor BP as she does,  She will need further eval if BP is consistently > 140/90

## 2011-02-04 ENCOUNTER — Ambulatory Visit: Payer: Self-pay

## 2011-02-04 DIAGNOSIS — J40 Bronchitis, not specified as acute or chronic: Secondary | ICD-10-CM

## 2011-02-04 DIAGNOSIS — R05 Cough: Secondary | ICD-10-CM

## 2011-02-15 ENCOUNTER — Other Ambulatory Visit: Payer: Self-pay | Admitting: Internal Medicine

## 2011-03-03 ENCOUNTER — Encounter: Payer: Self-pay | Admitting: Internal Medicine

## 2011-03-03 ENCOUNTER — Ambulatory Visit (INDEPENDENT_AMBULATORY_CARE_PROVIDER_SITE_OTHER): Payer: Self-pay | Admitting: Internal Medicine

## 2011-03-03 VITALS — BP 120/84 | HR 69 | Temp 97.6°F | Ht 61.0 in | Wt 108.0 lb

## 2011-03-03 DIAGNOSIS — H109 Unspecified conjunctivitis: Secondary | ICD-10-CM | POA: Insufficient documentation

## 2011-03-03 MED ORDER — TOBRAMYCIN 0.3 % OP SOLN: 1.0000 [drp] | Freq: Four times a day (QID) | OPHTHALMIC | Status: AC

## 2011-03-03 NOTE — Patient Instructions (Signed)
Take all new medications as prescribed Continue all other medications as before  

## 2011-03-04 ENCOUNTER — Encounter: Payer: Self-pay | Admitting: Internal Medicine

## 2011-03-04 NOTE — Progress Notes (Signed)
  Subjective:    Patient ID: [name], female    DOB: 13-Jan-1951, 61 y.o.   MRN: [id number]  HPI  Here with acute onset 2-3 days left eye redness, slight d/c cloudy, mild upper and lower eyelid puffiness and swelling, without significant pain, blurred vision, HA, fever, ST, cough though throat was mild scratchy yesterday as well.  Pt denies chest pain, increased sob or doe, wheezing, orthopnea, PND, increased LE swelling, palpitations, dizziness or syncope.  Pt denies new neurological symptoms such as new headache, or facial or extremity weakness or numbness Past Medical History  Diagnosis Date  . Hypothyroidism   . Diabetes mellitus     type 2- diet  . Depression   . Anxiety   . Hyperlipidemia   . Pneumonia 7-07  . Suicide attempt   . 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 DIABETES MELLITUS, TYPE II 02/15/2007  . Acute bronchitis 05/07/2010   Past Surgical History  Procedure Date  . Tonsillectomy   . Appendectomy 2007  . S/p right knee surgery     reports that she has been smoking.  She has never used smokeless tobacco. She reports that she does not drink alcohol or use illicit drugs. family history includes Alcohol abuse in her father, mother, and other; Cancer in her other; Heart disease in her mother; and Other in her father. Allergies  Allergen Reactions  . Fluoxetine Hcl     REACTION: rash  . Moxifloxacin     REACTION: rash   Current Outpatient Prescriptions on File Prior to Visit  Medication Sig Dispense Refill  . ACCU-CHEK AVIVA test strip USE AS DIRECTED  100 each  1  . aspirin 81 MG EC tablet Take 81 mg by mouth daily.       Marland Kitchen ibuprofen (ADVIL,MOTRIN) 200 MG tablet Take 200 mg by mouth daily as needed.        Marland Kitchen levothyroxine (SYNTHROID, LEVOTHROID) 125 MCG tablet TAKE ONE TABLET BY MOUTH EVERY DAY  30 tablet  11  . lovastatin (MEVACOR) 20 MG tablet TAKE ONE TABLET BY MOUTH EVERY DAY  30 tablet  11   Review of Systems All otherwise neg per pt,  neurontin did not help pain as per last visit    Objective:   Physical Exam BP 120/84  Pulse 69  Temp(Src) 97.6 F (36.4 C) (Oral)  Ht 5\' 1"  (1.549 m)  Wt 108 lb (48.988 kg)  BMI 20.41 kg/m2  SpO2 98% Physical Exam  VS noted, mild ill appearing Constitutional: Pt appears well-developed and well-nourished.  HENT: Head: Normocephalic.  Right Ear: External ear normal.  Left Ear: External ear normal.   Sinus nontender.  Pharynx mild erythema Eyes: left Conjunctivae midl erythema and slight d/c, bilat upper and lower eyelid mild swelling, erythema but nontender  Pupils are equal, round, and reactive to light. , right conjunct clear Neck: Normal range of motion. Neck supple.  Cardiovascular: Normal rate and regular rhythm.   Pulmonary/Chest: Effort normal and breath sounds normal.  Neurological: Pt is alert. No cranial nerve deficit.  Skin: Skin is warm. No erythema.  Psychiatric: Pt behavior is normal. Thought content normal.         Assessment & Plan:

## 2011-03-04 NOTE — Assessment & Plan Note (Signed)
Mild to mod, for antibx course,  to f/u any worsening symptoms or concerns 

## 2011-03-24 ENCOUNTER — Telehealth: Payer: Self-pay | Admitting: *Deleted

## 2011-03-24 DIAGNOSIS — M5416 Radiculopathy, lumbar region: Secondary | ICD-10-CM

## 2011-03-24 NOTE — Telephone Encounter (Signed)
Done per emr 

## 2011-03-24 NOTE — Telephone Encounter (Signed)
Patient would like a referral to [facility] for her left leg pain and MRI. The neurontin did not work. Please advise

## 2011-03-24 NOTE — Telephone Encounter (Signed)
Pt request call back from PCP nurse.

## 2011-03-31 ENCOUNTER — Encounter: Payer: Self-pay | Admitting: Internal Medicine

## 2011-03-31 ENCOUNTER — Ambulatory Visit (INDEPENDENT_AMBULATORY_CARE_PROVIDER_SITE_OTHER): Payer: Self-pay | Admitting: Internal Medicine

## 2011-03-31 VITALS — BP 110/70 | HR 77 | Temp 97.5°F | Ht 61.0 in | Wt 108.2 lb

## 2011-03-31 DIAGNOSIS — H1010 Acute atopic conjunctivitis, unspecified eye: Secondary | ICD-10-CM

## 2011-03-31 DIAGNOSIS — J309 Allergic rhinitis, unspecified: Secondary | ICD-10-CM | POA: Insufficient documentation

## 2011-03-31 DIAGNOSIS — H1045 Other chronic allergic conjunctivitis: Secondary | ICD-10-CM

## 2011-03-31 MED ORDER — PREDNISONE 10 MG PO TABS: 10.0000 mg | ORAL_TABLET | Freq: Every day | ORAL | Status: DC

## 2011-03-31 MED ORDER — TEMAZEPAM 30 MG PO CAPS: 30.0000 mg | ORAL_CAPSULE | Freq: Every evening | ORAL | Status: DC | PRN

## 2011-03-31 MED ORDER — FEXOFENADINE HCL 180 MG PO TABS: 180.0000 mg | ORAL_TABLET | Freq: Every day | ORAL | Status: AC

## 2011-03-31 NOTE — Patient Instructions (Addendum)
Take all new medications as prescribed Continue all other medications as before You can also take OTC allegra as needed, and zaditor asd otc

## 2011-04-01 ENCOUNTER — Encounter: Payer: Self-pay | Admitting: Internal Medicine

## 2011-04-01 NOTE — Progress Notes (Signed)
  Subjective:    Patient ID: [name], female    DOB: 1950/02/09, 62 y.o.   MRN: [id number]  HPI  Here to f/u.  Does have several wks ongoing nasal allergy symptoms with clear congestion, itch and sneeze, without fever, pain, ST, cough or wheezing.   Past Medical History  Diagnosis Date  . Hypothyroidism   . Diabetes mellitus     type 2- diet  . Depression   . Anxiety   . Hyperlipidemia   . Pneumonia 7-07  . Suicide attempt   . 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 DIABETES MELLITUS, TYPE II 02/15/2007  . Acute bronchitis 05/07/2010   Past Surgical History  Procedure Date  . Tonsillectomy   . Appendectomy 2007  . S/p right knee surgery     reports that she has been smoking.  She has never used smokeless tobacco. She reports that she does not drink alcohol or use illicit drugs. family history includes Alcohol abuse in her father, mother, and other; Cancer in her other; Heart disease in her mother; and Other in her father. Allergies  Allergen Reactions  . Fluoxetine Hcl     REACTION: rash  . Moxifloxacin     REACTION: rash   Current Outpatient Prescriptions on File Prior to Visit  Medication Sig Dispense Refill  . ACCU-CHEK AVIVA test strip USE AS DIRECTED  100 each  1  . aspirin 81 MG EC tablet Take 81 mg by mouth daily.       Marland Kitchen ibuprofen (ADVIL,MOTRIN) 200 MG tablet Take 200 mg by mouth daily as needed.        Marland Kitchen levothyroxine (SYNTHROID, LEVOTHROID) 125 MCG tablet TAKE ONE TABLET BY MOUTH EVERY DAY  30 tablet  11  . lovastatin (MEVACOR) 20 MG tablet TAKE ONE TABLET BY MOUTH EVERY DAY  30 tablet  11  . risperiDONE (RISPERDAL) 3 MG tablet Take 3 mg by mouth at bedtime.      . sertraline (ZOLOFT) 100 MG tablet Take 100 mg by mouth daily.       Review of Systems All otherwise neg per pt     Objective:   Physical Exam  BP 110/70  Pulse 77  Temp(Src) 97.5 F (36.4 C) (Oral)  Ht 5\' 1"  (1.549 m)  Wt 108 lb 4 oz (49.102 kg)  BMI 20.45 kg/m2  SpO2  97% Physical Exam  VS noted Constitutional: Pt appears well-developed and well-nourished.  HENT: Head: Normocephalic.  Right Ear: External ear normal.  Left Ear: External ear normal.  Bilat conjunct somewhat injected with clear slight drainage Bilat tm's mild erythema.  Sinus nontender.  Pharynx mild erythema Eyes: Conjunctivae and EOM are normal. Pupils are equal, round, and reactive to light.  Neck: Normal range of motion. Neck supple.  Cardiovascular: Normal rate and regular rhythm.   Pulmonary/Chest: Effort normal and breath sounds normal.  LE: no edema Assessment & Plan:

## 2011-04-01 NOTE — Assessment & Plan Note (Addendum)
Mild to mod, for allegra  And short predpack course,  to f/u any worsening symptoms or concerns

## 2011-04-01 NOTE — Assessment & Plan Note (Signed)
Mild to mod, for oct zaditor asd ,  to f/u any worsening symptoms or concerns

## 2011-04-03 ENCOUNTER — Inpatient Hospital Stay (HOSPITAL_COMMUNITY): Admission: RE | Admit: 2011-04-03 | Payer: Self-pay | Source: Ambulatory Visit

## 2011-04-14 ENCOUNTER — Telehealth: Payer: Self-pay

## 2011-04-14 NOTE — Telephone Encounter (Signed)
The patient called requesting MRI and Neurologist referral. Call back number is [phone]

## 2011-04-14 NOTE — Telephone Encounter (Signed)
I see where the order was place for the MRI and Neurosurgury feb 22  I suspect there may have been a communication issue such as a regular phone number to contact pt?  I will forward to [facility]

## 2011-04-17 ENCOUNTER — Telehealth: Payer: Self-pay | Admitting: Internal Medicine

## 2011-04-17 DIAGNOSIS — M5416 Radiculopathy, lumbar region: Secondary | ICD-10-CM

## 2011-04-17 NOTE — Telephone Encounter (Signed)
Pt missed her appt on 04-03-2011 for MR LUMBAR SPINE WO CONTRAST pt wants to reschedule but need a new order placed so we can reschedule her appt thank you

## 2011-04-17 NOTE — Telephone Encounter (Signed)
Order redone

## 2011-04-17 NOTE — Telephone Encounter (Signed)
Patient informed. 

## 2011-04-19 ENCOUNTER — Ambulatory Visit (HOSPITAL_COMMUNITY)
Admission: RE | Admit: 2011-04-19 | Discharge: 2011-04-19 | Disposition: A | Discharge status: Home / Self Care | Payer: Self-pay | Source: Ambulatory Visit | Attending: Internal Medicine | Admitting: Internal Medicine

## 2011-04-19 ENCOUNTER — Encounter: Payer: Self-pay | Admitting: Internal Medicine

## 2011-04-19 DIAGNOSIS — M545 Low back pain, unspecified: Secondary | ICD-10-CM | POA: Insufficient documentation

## 2011-04-19 DIAGNOSIS — M79609 Pain in unspecified limb: Secondary | ICD-10-CM | POA: Insufficient documentation

## 2011-04-19 DIAGNOSIS — M5416 Radiculopathy, lumbar region: Secondary | ICD-10-CM

## 2011-04-19 DIAGNOSIS — M129 Arthropathy, unspecified: Secondary | ICD-10-CM | POA: Insufficient documentation

## 2011-04-19 DIAGNOSIS — R262 Difficulty in walking, not elsewhere classified: Secondary | ICD-10-CM | POA: Insufficient documentation

## 2011-07-02 ENCOUNTER — Other Ambulatory Visit: Payer: Self-pay | Admitting: Internal Medicine

## 2011-08-24 ENCOUNTER — Encounter: Payer: Self-pay | Admitting: Internal Medicine

## 2011-08-24 ENCOUNTER — Ambulatory Visit (INDEPENDENT_AMBULATORY_CARE_PROVIDER_SITE_OTHER): Payer: Self-pay | Admitting: Internal Medicine

## 2011-08-24 VITALS — BP 100/70 | HR 67 | Temp 97.2°F | Ht 61.0 in | Wt 107.0 lb

## 2011-08-24 DIAGNOSIS — L989 Disorder of the skin and subcutaneous tissue, unspecified: Secondary | ICD-10-CM

## 2011-08-24 DIAGNOSIS — E119 Type 2 diabetes mellitus without complications: Secondary | ICD-10-CM

## 2011-08-24 NOTE — Progress Notes (Signed)
Subjective:    Patient ID: Carlynn Herald, female    DOB: 01/13/1951, 61 y.o.   MRN: [id number]  HPI  Here to f/u;  Here to f/u; overall doing ok,  Pt denies chest pain, increased sob or doe, wheezing, orthopnea, PND, increased LE swelling, palpitations, dizziness or syncope.  Pt denies new neurological symptoms such as new headache, or facial or extremity weakness or numbness   Pt denies polydipsia, polyuria, or low sugar symptoms such as weakness or confusion improved with po intake.  Pt states overall good compliance with meds, trying to follow lower cholesterol, diabetic diet, wt overall stable but little exercise however.  Denies hyper or hypo thyroid symptoms such as voice, skin or hair change.  Did accidentally pull the earring through the right lobe last month and healed successfully, but now left with a defect in the lobe and wonders if somehow I can make a new incision and stitch it so would heal better.   Past Medical History  Diagnosis Date  . Hypothyroidism   . Diabetes mellitus     type 2- diet  . Depression   . Anxiety   . Hyperlipidemia   . Pneumonia 7-07  . Suicide attempt   . 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 DIABETES MELLITUS, TYPE II 02/15/2007  . Acute bronchitis 05/07/2010   Past Surgical History  Procedure Date  . Tonsillectomy   . Appendectomy 2007  . S/p right knee surgery     reports that she has been smoking.  She has never used smokeless tobacco. She reports that she does not drink alcohol or use illicit drugs. family history includes Alcohol abuse in her father, mother, and other; Cancer in her other; Heart disease in her mother; and Other in her father. Allergies  Allergen Reactions  . Fluoxetine Hcl     REACTION: rash  . Moxifloxacin     REACTION: rash   Current Outpatient Prescriptions on File Prior to Visit  Medication Sig Dispense Refill  . ACCU-CHEK AVIVA test strip USE AS DIRECTED  100 each  1  . aspirin 81 MG EC tablet Take 81  mg by mouth daily.       . fexofenadine (ALLEGRA) 180 MG tablet Take 1 tablet (180 mg total) by mouth daily.  30 tablet  2  . ibuprofen (ADVIL,MOTRIN) 200 MG tablet Take 200 mg by mouth daily as needed.        Marland Kitchen levothyroxine (SYNTHROID, LEVOTHROID) 125 MCG tablet TAKE ONE TABLET BY MOUTH EVERY DAY  30 tablet  11  . lovastatin (MEVACOR) 20 MG tablet TAKE ONE TABLET BY MOUTH EVERY DAY  30 tablet  11  . sertraline (ZOLOFT) 100 MG tablet Take 100 mg by mouth daily.      . temazepam (RESTORIL) 30 MG capsule Take 1 capsule (30 mg total) by mouth at bedtime as needed.  30 capsule  5  . temazepam (RESTORIL) 30 MG capsule Take 1 capsule (30 mg total) by mouth at bedtime as needed for sleep.  30 capsule  1   Review of Systems All otherwise neg per pt    Objective:   Physical Exam BP 100/70  Pulse 67  Temp 97.2 F (36.2 C) (Oral)  Ht 5\' 1"  (1.549 m)  Wt 107 lb (48.535 kg)  BMI 20.22 kg/m2  SpO2 97% Physical Exam  VS noted Constitutional: Pt appears well-developed and well-nourished.  HENT: Head: Normocephalic.  Right Ear: External ear normal.  Left Ear:  External ear normal.  Right earlobe with a healed deformity from pulled earring. No redness, swelling  Eyes: Conjunctivae and EOM are normal. Pupils are equal, round, and reactive to light.  Neck: Normal range of motion. Neck supple.  Cardiovascular: Normal rate and regular rhythm.   Pulmonary/Chest: Effort normal and breath sounds normal.  Neurological: Pt is alert Skin: Skin is warm. No erythema.  Psychiatric: Pt behavior is normal. Thought content normal.     Assessment & Plan:

## 2011-08-24 NOTE — Assessment & Plan Note (Signed)
D/w pt the right earlobe deformity, is currently healed, and ideally would need plastic surgury to less deformity, she declines at this time

## 2011-08-24 NOTE — Patient Instructions (Addendum)
Please call if you would like referral to plastic surgury Please return in 6 months, or sooner if needed

## 2011-08-24 NOTE — Assessment & Plan Note (Signed)
stable overall by hx and exam, most recent data reviewed with pt, and pt to continue medical treatment as before Lab Results  Component Value Date   HGBA1C 6.4 07/02/2009   Declines lab today, but should have Insurance under Obamacare after jan 1 - will plan to return then

## 2011-10-19 ENCOUNTER — Ambulatory Visit (INDEPENDENT_AMBULATORY_CARE_PROVIDER_SITE_OTHER)
Admission: RE | Admit: 2011-10-19 | Discharge: 2011-10-19 | Disposition: A | Discharge status: Home / Self Care | Payer: Self-pay | Source: Ambulatory Visit | Attending: Endocrinology | Admitting: Endocrinology

## 2011-10-19 ENCOUNTER — Encounter: Payer: Self-pay | Admitting: Endocrinology

## 2011-10-19 ENCOUNTER — Ambulatory Visit (INDEPENDENT_AMBULATORY_CARE_PROVIDER_SITE_OTHER): Payer: Self-pay | Admitting: Endocrinology

## 2011-10-19 VITALS — BP 110/76 | HR 81 | Temp 98.8°F | Ht 61.0 in | Wt 105.0 lb

## 2011-10-19 DIAGNOSIS — R05 Cough: Secondary | ICD-10-CM

## 2011-10-19 MED ORDER — DOXYCYCLINE HYCLATE 100 MG PO TABS: 100.0000 mg | ORAL_TABLET | Freq: Two times a day (BID) | ORAL | Status: DC

## 2011-10-19 NOTE — Progress Notes (Signed)
  Subjective:    Patient ID: [name], female    DOB: 1950-05-02, 61 y.o.   MRN: [id number]  HPI Pt states few days of moderate prod-quality cough in the chest, and assoc nasal congestion. Past Medical History  Diagnosis Date  . Hypothyroidism   . Diabetes mellitus     type 2- diet  . Depression   . Anxiety   . Hyperlipidemia   . Pneumonia 7-07  . Suicide attempt   . 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 DIABETES MELLITUS, TYPE II 02/15/2007  . Acute bronchitis 05/07/2010    Past Surgical History  Procedure Date  . Tonsillectomy   . Appendectomy 2007  . S/p right knee surgery     History   Social History  . Marital Status: Widowed    Spouse Name: N/A    Number of Children: N/A  . Years of Education: N/A   Occupational History  . Not on file.   Social History Main Topics  . Smoking status: Current Every Day Smoker  . Smokeless tobacco: Never Used  . Alcohol Use: No  . Drug Use: No  . Sexually Active: Not on file   Other Topics Concern  . Not on file   Social History Narrative  . No narrative on file    Current Outpatient Prescriptions on File Prior to Visit  Medication Sig Dispense Refill  . ACCU-CHEK AVIVA test strip USE AS DIRECTED  100 each  1  . aspirin 81 MG EC tablet Take 81 mg by mouth daily.       . fexofenadine (ALLEGRA) 180 MG tablet Take 1 tablet (180 mg total) by mouth daily.  30 tablet  2  . ibuprofen (ADVIL,MOTRIN) 200 MG tablet Take 200 mg by mouth daily as needed.        Marland Kitchen levothyroxine (SYNTHROID, LEVOTHROID) 125 MCG tablet TAKE ONE TABLET BY MOUTH EVERY DAY  30 tablet  11  . lovastatin (MEVACOR) 20 MG tablet TAKE ONE TABLET BY MOUTH EVERY DAY  30 tablet  11  . sertraline (ZOLOFT) 100 MG tablet Take 100 mg by mouth daily.      . temazepam (RESTORIL) 30 MG capsule Take 1 capsule (30 mg total) by mouth at bedtime as needed.  30 capsule  5  . temazepam (RESTORIL) 30 MG capsule Take 1 capsule (30 mg total) by mouth at bedtime  as needed for sleep.  30 capsule  1    Allergies  Allergen Reactions  . Fluoxetine Hcl     REACTION: rash  . Moxifloxacin     REACTION: rash    Family History  Problem Relation Age of Onset  . Heart disease Mother     smoker  . Alcohol abuse Mother   . Other Father     CABG  . Alcohol abuse Father   . Alcohol abuse Other   . Cancer Other     liver    BP 110/76  Pulse 81  Temp 98.8 F (37.1 C) (Oral)  Ht 5\' 1"  (1.549 m)  Wt 105 lb (47.628 kg)  BMI 19.84 kg/m2  SpO2 95%  Review of Systems Denies fever and wheezing    Objective:   Physical Exam VITAL SIGNS:  See vs page GENERAL: no distress head: no deformity eyes: no periorbital swelling, no proptosis external nose and ears are normal mouth: no lesion seen LUNGS:  Clear to auscultation     Assessment & Plan:  URI, new

## 2011-10-19 NOTE — Patient Instructions (Addendum)
Here is a prescription for an antibiotic.   I hope you feel better soon.  If you don't feel better by next week, please call back.  Please call sooner if you get worse. A chest-x-ray is requested for you today.  You will receive a letter with results.

## 2011-10-24 ENCOUNTER — Telehealth: Payer: Self-pay

## 2011-10-24 DIAGNOSIS — R918 Other nonspecific abnormal finding of lung field: Secondary | ICD-10-CM | POA: Insufficient documentation

## 2011-10-24 NOTE — Telephone Encounter (Signed)
i ordered

## 2011-10-24 NOTE — Telephone Encounter (Signed)
Pt called stating that she has changed her mind and would like to process with CT scan as recommended.

## 2011-11-01 ENCOUNTER — Ambulatory Visit (INDEPENDENT_AMBULATORY_CARE_PROVIDER_SITE_OTHER)
Admission: RE | Admit: 2011-11-01 | Discharge: 2011-11-01 | Disposition: A | Discharge status: Home / Self Care | Payer: Self-pay | Source: Ambulatory Visit | Attending: Endocrinology | Admitting: Endocrinology

## 2011-11-01 DIAGNOSIS — R918 Other nonspecific abnormal finding of lung field: Secondary | ICD-10-CM

## 2011-11-02 ENCOUNTER — Other Ambulatory Visit: Payer: Self-pay | Admitting: Internal Medicine

## 2011-11-02 NOTE — Telephone Encounter (Signed)
Done hardcopy to robin  

## 2011-11-02 NOTE — Telephone Encounter (Signed)
Faxed hardcopy to pharmacy. 

## 2012-02-21 ENCOUNTER — Other Ambulatory Visit: Payer: Self-pay | Admitting: Internal Medicine

## 2012-02-26 ENCOUNTER — Other Ambulatory Visit (INDEPENDENT_AMBULATORY_CARE_PROVIDER_SITE_OTHER): Payer: BC Managed Care – PPO

## 2012-02-26 ENCOUNTER — Encounter: Payer: Self-pay | Admitting: Internal Medicine

## 2012-02-26 ENCOUNTER — Ambulatory Visit (INDEPENDENT_AMBULATORY_CARE_PROVIDER_SITE_OTHER): Payer: BC Managed Care – PPO | Admitting: Internal Medicine

## 2012-02-26 VITALS — BP 112/78 | HR 84 | Temp 98.0°F | Ht 61.0 in | Wt 109.1 lb

## 2012-02-26 DIAGNOSIS — I2510 Atherosclerotic heart disease of native coronary artery without angina pectoris: Secondary | ICD-10-CM

## 2012-02-26 DIAGNOSIS — R911 Solitary pulmonary nodule: Secondary | ICD-10-CM | POA: Insufficient documentation

## 2012-02-26 DIAGNOSIS — E119 Type 2 diabetes mellitus without complications: Secondary | ICD-10-CM

## 2012-02-26 DIAGNOSIS — Z0000 Encounter for general adult medical examination without abnormal findings: Secondary | ICD-10-CM

## 2012-02-26 DIAGNOSIS — Z23 Encounter for immunization: Secondary | ICD-10-CM

## 2012-02-26 DIAGNOSIS — Z2911 Encounter for prophylactic immunotherapy for respiratory syncytial virus (RSV): Secondary | ICD-10-CM

## 2012-02-26 HISTORY — DX: Atherosclerotic heart disease of native coronary artery without angina pectoris: I25.10

## 2012-02-26 LAB — URINALYSIS, ROUTINE W REFLEX MICROSCOPIC
Bilirubin Urine: NEGATIVE
Ketones, ur: NEGATIVE
Leukocytes, UA: NEGATIVE
Specific Gravity, Urine: 1.02 (ref 1.000–1.030)
Urine Glucose: NEGATIVE
pH: 5.5 (ref 5.0–8.0)

## 2012-02-26 LAB — CBC WITH DIFFERENTIAL/PLATELET
Basophils Absolute: 0.1 10*3/uL (ref 0.0–0.1)
Eosinophils Relative: 0.7 % (ref 0.0–5.0)
HCT: 43.6 % (ref 36.0–46.0)
Hemoglobin: 14.9 g/dL (ref 12.0–15.0)
Lymphs Abs: 2.8 10*3/uL (ref 0.7–4.0)
MCV: 97.2 fl (ref 78.0–100.0)
Monocytes Absolute: 0.5 10*3/uL (ref 0.1–1.0)
Monocytes Relative: 5.1 % (ref 3.0–12.0)
Neutro Abs: 5.9 10*3/uL (ref 1.4–7.7)
Platelets: 234 10*3/uL (ref 150.0–400.0)
RDW: 13.6 % (ref 11.5–14.6)

## 2012-02-26 LAB — BASIC METABOLIC PANEL
Calcium: 9 mg/dL (ref 8.4–10.5)
GFR: 109.72 mL/min (ref >60.00)
Potassium: 3.4 mEq/L — ABNORMAL LOW (ref 3.5–5.1)
Sodium: 135 mEq/L (ref 135–145)

## 2012-02-26 LAB — HEMOGLOBIN A1C: Hgb A1c MFr Bld: 6.4 % (ref 4.6–6.5)

## 2012-02-26 LAB — LIPID PANEL
HDL: 43.3 mg/dL (ref >39.00)
LDL Cholesterol: 83 mg/dL (ref 0–99)
Total CHOL/HDL Ratio: 3
VLDL: 20 mg/dL (ref 0.0–40.0)

## 2012-02-26 LAB — HEPATIC FUNCTION PANEL
AST: 18 U/L (ref 0–37)
Alkaline Phosphatase: 75 U/L (ref 39–117)
Total Bilirubin: 0.4 mg/dL (ref 0.3–1.2)

## 2012-02-26 LAB — TSH: TSH: 0.64 u[IU]/mL (ref 0.35–5.50)

## 2012-02-26 MED ORDER — LEVOTHYROXINE SODIUM 125 MCG PO TABS: ORAL_TABLET | ORAL | Status: DC

## 2012-02-26 MED ORDER — LOVASTATIN 20 MG PO TABS: ORAL_TABLET | ORAL | Status: DC

## 2012-02-26 NOTE — Assessment & Plan Note (Signed)

## 2012-02-26 NOTE — Addendum Note (Signed)
Addended by: Scharlene Gloss B on: 02/26/2012 01:47 PM   Modules accepted: Orders

## 2012-02-26 NOTE — Patient Instructions (Addendum)
Please stop smoking Please remember to followup with your GYN for the yearly pap smear and/or mammogram (such as [facility], and [facility] on [address]) Please continue all other medications as before, and refills have been done for the synthroid and lovastatin Please continue your efforts at being more active, low cholesterol diet, and weight control. Please have the pharmacy call with any other refills you may need. You had the shingles shot today Please call if you change your mind about the colonoscopy (please check with your insurance about your copay) You will be contacted regarding the referral for: CT chest Please go to the LAB in the Basement (turn left off the elevator) for the tests to be done today You will be contacted by phone if any changes need to be made immediately.  Otherwise, you will receive a letter about your results with an explanation, Please remember to sign up for My Chart if you have not done so, as this will be important to you in the future with finding out test results, communicating by private email, and scheduling acute appointments online when needed. Please return in 6 months, or sooner if needed

## 2012-02-26 NOTE — Progress Notes (Signed)
Subjective:    Patient ID: [name], female    DOB: 1950-09-04, 62 y.o.   MRN: [id number]  HPI Here for wellness and f/u;  Overall doing ok;  Pt denies CP, worsening SOB, DOE, wheezing, orthopnea, PND, worsening LE edema, palpitations, dizziness or syncope.  Pt denies neurological change such as new headache, facial or extremity weakness.  Pt denies polydipsia, polyuria, or low sugar symptoms. Pt states overall good compliance with treatment and medications, good tolerability, and has been trying to follow lower cholesterol diet.  Pt denies worsening depressive symptoms, suicidal ideation or panic. No fever, night sweats, wt loss, loss of appetite, or other constitutional symptoms.  Pt states good ability with ADL's, has low fall risk, home safety reviewed and adequate, no other significant changes in hearing or vision, and only occasionally active with exercise.  Still smoking, had cxr with f/u CT chest in sept 2013 per Dr Everardo All  - has 6.8 mm nodule.  Pt requests f/u.  Ct also had coronary ca+'s.  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 Hypothyroidism   . Diabetes mellitus     type 2- diet  . Depression   . Anxiety   . Hyperlipidemia   . Pneumonia 7-07  . Suicide attempt   . 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 DIABETES MELLITUS, TYPE II 02/15/2007  . Acute bronchitis 05/07/2010   Past Surgical History  Procedure Date  . Tonsillectomy   . Appendectomy 2007  . S/p right knee surgery     reports that she has been smoking.  She has never used smokeless tobacco. She reports that she does not drink alcohol or use illicit drugs. family history includes Alcohol abuse in her father, mother, and other; Cancer in her other; Heart disease in her mother; and Other in her father. Allergies  Allergen Reactions  . Fluoxetine Hcl     REACTION: rash  . Moxifloxacin     REACTION: rash   Current  Outpatient Prescriptions on File Prior to Visit  Medication Sig Dispense Refill  . ACCU-CHEK AVIVA test strip USE AS DIRECTED  100 each  1  . aspirin 81 MG EC tablet Take 81 mg by mouth daily.       . fexofenadine (ALLEGRA) 180 MG tablet Take 1 tablet (180 mg total) by mouth daily.  30 tablet  2  . ibuprofen (ADVIL,MOTRIN) 200 MG tablet Take 200 mg by mouth daily as needed.        Marland Kitchen levothyroxine (SYNTHROID, LEVOTHROID) 125 MCG tablet TAKE ONE TABLET BY MOUTH EVERY DAY  30 tablet  5  . lovastatin (MEVACOR) 20 MG tablet TAKE ONE TABLET BY MOUTH EVERY DAY  30 tablet  11  . risperiDONE (RISPERDAL) 3 MG tablet Take 3 mg by mouth at bedtime.      . sertraline (ZOLOFT) 100 MG tablet Take 100 mg by mouth daily.      . temazepam (RESTORIL) 30 MG capsule Take 1 capsule (30 mg total) by mouth at bedtime as needed.  30 capsule  5  . temazepam (RESTORIL) 30 MG capsule TAKE ONE CAPSULE BY MOUTH AT BEDTIME AS NEEDED  30 capsule  5  . temazepam (RESTORIL) 30 MG capsule Take 1 capsule (30 mg total) by mouth at bedtime as needed for sleep.  30 capsule  1   Review of Systems Constitutional: Negative for diaphoresis, activity  change, appetite change or unexpected weight change.  HENT: Negative for hearing loss, ear pain, facial swelling, mouth sores and neck stiffness.   Eyes: Negative for pain, redness and visual disturbance.  Respiratory: Negative for shortness of breath and wheezing.   Cardiovascular: Negative for chest pain and palpitations.  Gastrointestinal: Negative for diarrhea, blood in stool, abdominal distention or other pain Genitourinary: Negative for hematuria, flank pain or change in urine volume.  Musculoskeletal: Negative for myalgias and joint swelling.  Skin: Negative for color change and wound.  Neurological: Negative for syncope and numbness. other than noted Hematological: Negative for adenopathy.  Psychiatric/Behavioral: Negative for hallucinations, self-injury, decreased concentration  and agitation.      Objective:   Physical Exam BP 112/78  Pulse 84  Temp 98 F (36.7 C) (Oral)  Ht 5\' 1"  (1.549 m)  Wt 109 lb 2 oz (49.499 kg)  BMI 20.62 kg/m2  SpO2 93% VS noted,  Constitutional: Pt is oriented to person, place, and time. Appears well-developed and well-nourished.  Head: Normocephalic and atraumatic.  Right Ear: External ear normal.  Left Ear: External ear normal.  Nose: Nose normal.  Mouth/Throat: Oropharynx is clear and moist.  Eyes: Conjunctivae and EOM are normal. Pupils are equal, round, and reactive to light.  Neck: Normal range of motion. Neck supple. No JVD present. No tracheal deviation present.  Cardiovascular: Normal rate, regular rhythm, normal heart sounds and intact distal pulses.   Pulmonary/Chest: Effort normal and breath sounds normal.  Abdominal: Soft. Bowel sounds are normal. There is no tenderness. No HSM  Musculoskeletal: Normal range of motion. Exhibits no edema.  Lymphadenopathy:  Has no cervical adenopathy.  Neurological: Pt is alert and oriented to person, place, and time. Pt has normal reflexes. No cranial nerve deficit.  Skin: Skin is warm and dry. No rash noted.  Psychiatric:  Has  normal mood and affect. Behavior is normal.     Assessment & Plan:

## 2012-02-26 NOTE — Assessment & Plan Note (Signed)
For f/u ct chest, no cm

## 2012-02-26 NOTE — Assessment & Plan Note (Signed)
stable overall by history and exam, recent data reviewed with pt, and pt to continue medical treatment as before,  to f/u any worsening symptoms or concerns, for f/u lab today Lab Results  Component Value Date   HGBA1C 6.4 07/02/2009

## 2012-02-29 ENCOUNTER — Ambulatory Visit: Payer: Self-pay | Admitting: Internal Medicine

## 2012-07-22 ENCOUNTER — Other Ambulatory Visit: Payer: Self-pay | Admitting: Internal Medicine

## 2012-07-23 NOTE — Telephone Encounter (Signed)
Faxed hardcopy to [facility]

## 2012-07-23 NOTE — Telephone Encounter (Signed)
Done hardcopy to robin  

## 2012-07-29 ENCOUNTER — Ambulatory Visit (INDEPENDENT_AMBULATORY_CARE_PROVIDER_SITE_OTHER): Payer: BC Managed Care – PPO | Admitting: Internal Medicine

## 2012-07-29 ENCOUNTER — Encounter: Payer: Self-pay | Admitting: Internal Medicine

## 2012-07-29 VITALS — BP 150/100 | HR 80 | Temp 97.7°F | Ht 61.0 in | Wt 102.0 lb

## 2012-07-29 DIAGNOSIS — J019 Acute sinusitis, unspecified: Secondary | ICD-10-CM | POA: Insufficient documentation

## 2012-07-29 DIAGNOSIS — E119 Type 2 diabetes mellitus without complications: Secondary | ICD-10-CM

## 2012-07-29 DIAGNOSIS — E785 Hyperlipidemia, unspecified: Secondary | ICD-10-CM

## 2012-07-29 DIAGNOSIS — I10 Essential (primary) hypertension: Secondary | ICD-10-CM | POA: Insufficient documentation

## 2012-07-29 DIAGNOSIS — H9190 Unspecified hearing loss, unspecified ear: Secondary | ICD-10-CM

## 2012-07-29 DIAGNOSIS — H9192 Unspecified hearing loss, left ear: Secondary | ICD-10-CM | POA: Insufficient documentation

## 2012-07-29 MED ORDER — AZITHROMYCIN 250 MG PO TABS: ORAL_TABLET | ORAL | Status: DC

## 2012-07-29 MED ORDER — AMLODIPINE BESYLATE 5 MG PO TABS: 5.0000 mg | ORAL_TABLET | Freq: Every day | ORAL | Status: DC

## 2012-07-29 NOTE — Assessment & Plan Note (Signed)
stable overall by history and exam, recent data reviewed with pt, and pt to continue medical treatment as before,  to f/u any worsening symptoms or concerns Lab Results  Component Value Date   LDLCALC 83 02/26/2012

## 2012-07-29 NOTE — Patient Instructions (Signed)
Please take all new medication as prescribed - the antibiotic, and the blood pressure medication Please continue all other medications as before, and refills have been done if requested. Please have the pharmacy call with any other refills you may need.  Your Left ear was irrigated of wax today You can also use Mucinex OTC for ear congestion as needed  Please continue your efforts at being more active, low cholesterol diet, and weight control.  Please keep your appointment on July 28 to follow up the blood pressure, which should be improved by then

## 2012-07-29 NOTE — Assessment & Plan Note (Signed)
New onset, benign, for amlodipine 5 qd, /fu next visit

## 2012-07-29 NOTE — Assessment & Plan Note (Signed)
stable overall by history and exam, recent data reviewed with pt, and pt to continue medical treatment as before,  to f/u any worsening symptoms or concerns Lab Results  Component Value Date   HGBA1C 6.4 02/26/2012

## 2012-07-29 NOTE — Assessment & Plan Note (Signed)
Mild to mod, for antibx course,  to f/u any worsening symptoms or concerns 

## 2012-07-29 NOTE — Assessment & Plan Note (Signed)
Improved with irrigation,  to f/u any worsening symptoms or concerns  

## 2012-07-29 NOTE — Progress Notes (Signed)
Subjective:    Patient ID: [name], female    DOB: 1950-04-24, 62 y.o.   MRN: [id number]  HPI  Here with 2-3 days acute onset fever, facial pain, pressure, headache, general weakness and malaise, and greenish d/c, with mild ST and cough, but pt denies chest pain, wheezing, increased sob or doe, orthopnea, PND, increased LE swelling, palpitations, dizziness or syncope.  Also with sudden left hearing loss  - ? Wax, in the past wk, on top of mild eustachian valve symptoms as well bilat.   Past Medical History  Diagnosis Date  . Hypothyroidism   . Diabetes mellitus     type 2- diet  . Depression   . Anxiety   . Hyperlipidemia   . Pneumonia 7-07  . Suicide attempt   . 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 DIABETES MELLITUS, TYPE II 02/15/2007  . Acute bronchitis 05/07/2010  . Coronary artery calcification seen on CAT scan 02/26/2012    Sept 2013   Past Surgical History  Procedure Laterality Date  . Tonsillectomy    . Appendectomy  2007  . S/p right knee surgery      reports that she has been smoking.  She has never used smokeless tobacco. She reports that she does not drink alcohol or use illicit drugs. family history includes Alcohol abuse in her father, mother, and other; Cancer in her other; Heart disease in her mother; and Other in her father. Allergies  Allergen Reactions  . Fluoxetine Hcl     REACTION: rash  . Moxifloxacin     REACTION: rash   Current Outpatient Prescriptions on File Prior to Visit  Medication Sig Dispense Refill  . ACCU-CHEK AVIVA test strip USE AS DIRECTED  100 each  1  . aspirin 81 MG EC tablet Take 81 mg by mouth daily.       Marland Kitchen ibuprofen (ADVIL,MOTRIN) 200 MG tablet Take 200 mg by mouth daily as needed.        Marland Kitchen levothyroxine (SYNTHROID, LEVOTHROID) 125 MCG tablet TAKE ONE TABLET BY MOUTH EVERY DAY  90 tablet  3  . lovastatin (MEVACOR) 20 MG tablet TAKE ONE TABLET BY MOUTH EVERY DAY  90 tablet  3  . risperiDONE (RISPERDAL) 3 MG tablet  Take 3 mg by mouth at bedtime.      . sertraline (ZOLOFT) 100 MG tablet Take 100 mg by mouth daily.      . temazepam (RESTORIL) 30 MG capsule TAKE ONE CAPSULE BY MOUTH AT BEDTIME AS NEEDED  30 capsule  5  . fexofenadine (ALLEGRA) 180 MG tablet Take 1 tablet (180 mg total) by mouth daily.  30 tablet  2   No current facility-administered medications on file prior to visit.   Review of Systems  Constitutional: Negative for unexpected weight change, or unusual diaphoresis  HENT: Negative for tinnitus.   Eyes: Negative for photophobia and visual disturbance.  Respiratory: Negative for choking and stridor.   Gastrointestinal: Negative for vomiting and blood in stool.  Genitourinary: Negative for hematuria and decreased urine volume.  Musculoskeletal: Negative for acute joint swelling Skin: Negative for color change and wound.  Neurological: Negative for tremors and numbness other than noted  Psychiatric/Behavioral: Negative for decreased concentration or  hyperactivity.       Objective:   Physical Exam BP 150/100  Pulse 80  Temp(Src) 97.7 F (36.5 C) (Oral)  Ht 5\' 1"  (1.549 m)  Wt 102 lb (46.267 kg)  BMI 19.28 kg/m2  SpO2  95% VS noted, mild ill Constitutional: Pt appears well-developed and well-nourished.  HENT: Head: NCAT.  Right Ear: External ear normal.  Left Ear: External ear normal. Left ear canal impaction improved with irrigation, hearing improved Bilat tm's with mild erythema.  Max sinus areas mild tender.  Pharynx with mild erythema, no exudate Eyes: Conjunctivae and EOM are normal. Pupils are equal, round, and reactive to light.  Neck: Normal range of motion. Neck supple.  Cardiovascular: Normal rate and regular rhythm.   Pulmonary/Chest: Effort normal and breath sounds normal.  Abd:  Soft, NT, no bruit Neurological: Pt is alert. Not confused  Skin: Skin is warm. No erythema.  Psychiatric: Pt behavior is normal. Thought content normal.     Assessment & Plan:

## 2012-08-26 ENCOUNTER — Ambulatory Visit: Payer: BC Managed Care – PPO | Admitting: Internal Medicine

## 2013-01-08 ENCOUNTER — Encounter: Payer: Self-pay | Admitting: Internal Medicine

## 2013-01-08 ENCOUNTER — Ambulatory Visit (INDEPENDENT_AMBULATORY_CARE_PROVIDER_SITE_OTHER): Payer: BC Managed Care – PPO | Admitting: Internal Medicine

## 2013-01-08 VITALS — BP 120/80 | HR 86 | Temp 97.0°F | Ht 61.0 in | Wt 104.5 lb

## 2013-01-08 DIAGNOSIS — E119 Type 2 diabetes mellitus without complications: Secondary | ICD-10-CM

## 2013-01-08 DIAGNOSIS — I10 Essential (primary) hypertension: Secondary | ICD-10-CM

## 2013-01-08 DIAGNOSIS — R1903 Right lower quadrant abdominal swelling, mass and lump: Secondary | ICD-10-CM | POA: Insufficient documentation

## 2013-01-08 NOTE — Assessment & Plan Note (Signed)
stable overall by history and exam, recent data reviewed with pt, and pt to continue medical treatment as before,  to f/u any worsening symptoms or concerns Lab Results  Component Value Date   HGBA1C 6.4 02/26/2012

## 2013-01-08 NOTE — Assessment & Plan Note (Signed)
stable overall by history and exam, recent data reviewed with pt, and pt to continue medical treatment as before,  to f/u any worsening symptoms or concerns BP Readings from Last 3 Encounters:  01/08/13 120/80  07/29/12 150/100  02/26/12 112/78

## 2013-01-08 NOTE — Patient Instructions (Addendum)
I believe you have a small hernia in the right lower abd area, but to further evaluate, : You will be contacted regarding the referral for: pelvic ultrasound  If the ultrasound test is negative, we may wish to have you see a General Surgeon for a possible hernia  Please continue all other medications as before, and refills have been done if requested. Please have the pharmacy call with any other refills you may need.  Please remember to sign up for My Chart if you have not done so, as this will be important to you in the future with finding out test results, communicating by private email, and scheduling acute appointments online when needed.

## 2013-01-08 NOTE — Progress Notes (Signed)
Pre-visit discussion using our clinic review tool. No additional management support is needed unless otherwise documented below in the visit note.  

## 2013-01-08 NOTE — Assessment & Plan Note (Signed)
?   Hernia RLQ vs other; for pelvic u/s, but given lack of other symtpoms would consider gen surg referral on pt request,  to f/u any worsening symptoms or concerns

## 2013-01-08 NOTE — Progress Notes (Signed)
Subjective:    Patient ID: [name], female    DOB: 03-10-1950, 62 y.o.   MRN: [id number]  HPI  Here with c/o 2-3 mo nonpainful but swollen area to RLQ, worse to stand, better to lie down.  Pt denies chest pain, increased sob or doe, wheezing, orthopnea, PND, increased LE swelling, palpitations, dizziness or syncope.  Pt denies new neurological symptoms such as new headache, or facial or extremity weakness or numbness  . Denies worsening reflux, abd pain, dysphagia, n/v, bowel change or blood.  Denies urinary symptoms such as dysuria, frequency, urgency, flank pain, hematuria or n/v, fever, chills.  Pt denies polydipsia, polyuria,  Past Medical History  Diagnosis Date  . Hypothyroidism   . Diabetes mellitus     type 2- diet  . Depression   . Anxiety   . Hyperlipidemia   . Pneumonia 7-07  . Suicide attempt   . 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 DIABETES MELLITUS, TYPE II 02/15/2007  . Acute bronchitis 05/07/2010  . Coronary artery calcification seen on CAT scan 02/26/2012    Sept 2013   Past Surgical History  Procedure Laterality Date  . Tonsillectomy    . Appendectomy  2007  . S/p right knee surgery      reports that she has been smoking.  She has never used smokeless tobacco. She reports that she does not drink alcohol or use illicit drugs. family history includes Alcohol abuse in her father, mother, and other; Cancer in her other; Heart disease in her mother; Other in her father. Allergies  Allergen Reactions  . Fluoxetine Hcl     REACTION: rash  . Moxifloxacin     REACTION: rash   Current Outpatient Prescriptions on File Prior to Visit  Medication Sig Dispense Refill  . ACCU-CHEK AVIVA test strip USE AS DIRECTED  100 each  1  . amLODipine (NORVASC) 5 MG tablet Take 1 tablet (5 mg total) by mouth daily.  90 tablet  3  . aspirin 81 MG EC tablet Take 81 mg by mouth daily.       Marland Kitchen azithromycin (ZITHROMAX Z-PAK) 250 MG tablet Use as directed  6 each  1  .  ibuprofen (ADVIL,MOTRIN) 200 MG tablet Take 200 mg by mouth daily as needed.        Marland Kitchen levothyroxine (SYNTHROID, LEVOTHROID) 125 MCG tablet TAKE ONE TABLET BY MOUTH EVERY DAY  90 tablet  3  . lovastatin (MEVACOR) 20 MG tablet TAKE ONE TABLET BY MOUTH EVERY DAY  90 tablet  3  . risperiDONE (RISPERDAL) 3 MG tablet Take 3 mg by mouth at bedtime.      . sertraline (ZOLOFT) 100 MG tablet Take 100 mg by mouth daily.      . temazepam (RESTORIL) 30 MG capsule TAKE ONE CAPSULE BY MOUTH AT BEDTIME AS NEEDED  30 capsule  5  . fexofenadine (ALLEGRA) 180 MG tablet Take 1 tablet (180 mg total) by mouth daily.  30 tablet  2   No current facility-administered medications on file prior to visit.   Review of Systems  Constitutional: Negative for unexpected weight change, or unusual diaphoresis  HENT: Negative for tinnitus.   Eyes: Negative for photophobia and visual disturbance.  Respiratory: Negative for choking and stridor.   Gastrointestinal: Negative for vomiting and blood in stool.  Genitourinary: Negative for hematuria and decreased urine volume.  Musculoskeletal: Negative for acute joint swelling Skin: Negative for color change and wound.  Neurological: Negative  for tremors and numbness other than noted  Psychiatric/Behavioral: Negative for decreased concentration or  hyperactivity.       Objective:   Physical Exam BP 120/80  Pulse 86  Temp(Src) 97 F (36.1 C) (Oral)  Ht 5\' 1"  (1.549 m)  Wt 104 lb 8 oz (47.401 kg)  BMI 19.76 kg/m2  SpO2 94% VS noted, not ill appearing Constitutional: Pt appears well-developed and well-nourished.  HENT: Head: NCAT.  Right Ear: External ear normal.  Left Ear: External ear normal.  Eyes: Conjunctivae and EOM are normal. Pupils are equal, round, and reactive to light.  Neck: Normal range of motion. Neck supple.  Cardiovascular: Normal rate and regular rhythm.   Pulmonary/Chest: Effort normal and breath sounds normal.  Abd:  Soft, NT, + BS but nondiscrete NT  swelling to RLQ - ? Hernia vs otehr Neurological: Pt is alert. Not confused  Skin: Skin is warm. No erythema. No LE edema Psychiatric: Pt behavior is normal. Thought content normal.     Assessment & Plan:

## 2013-01-16 ENCOUNTER — Encounter: Payer: Self-pay | Admitting: Internal Medicine

## 2013-01-16 ENCOUNTER — Ambulatory Visit
Admission: RE | Admit: 2013-01-16 | Discharge: 2013-01-16 | Disposition: A | Discharge status: Home / Self Care | Payer: BC Managed Care – PPO | Source: Ambulatory Visit | Attending: Internal Medicine | Admitting: Internal Medicine

## 2013-01-16 DIAGNOSIS — R1903 Right lower quadrant abdominal swelling, mass and lump: Secondary | ICD-10-CM

## 2013-02-12 ENCOUNTER — Other Ambulatory Visit: Payer: Self-pay | Admitting: Internal Medicine

## 2013-02-12 NOTE — Telephone Encounter (Signed)
Done hardcopy to robin  

## 2013-02-13 NOTE — Telephone Encounter (Signed)
Faxed hardcopy to [facility]. 

## 2013-03-21 ENCOUNTER — Other Ambulatory Visit: Payer: Self-pay | Admitting: Internal Medicine

## 2013-05-19 ENCOUNTER — Other Ambulatory Visit: Payer: Self-pay | Admitting: Internal Medicine

## 2013-08-04 ENCOUNTER — Telehealth: Payer: Self-pay | Admitting: *Deleted

## 2013-08-04 DIAGNOSIS — E119 Type 2 diabetes mellitus without complications: Secondary | ICD-10-CM

## 2013-08-04 NOTE — Telephone Encounter (Signed)
Patient to call back and schedule an appointment to follow up with diabetes and cholesterol. Lipid, bmet, a1c ordered. Diabetic  bundle

## 2013-09-10 ENCOUNTER — Telehealth: Payer: Self-pay | Admitting: Internal Medicine

## 2013-09-10 NOTE — Telephone Encounter (Signed)
Left message for patient to call back to schedule cpe.  Last cpe was 02/26/12.

## 2013-10-03 ENCOUNTER — Other Ambulatory Visit (INDEPENDENT_AMBULATORY_CARE_PROVIDER_SITE_OTHER): Payer: BC Managed Care – PPO

## 2013-10-03 ENCOUNTER — Encounter: Payer: Self-pay | Admitting: Internal Medicine

## 2013-10-03 ENCOUNTER — Ambulatory Visit (INDEPENDENT_AMBULATORY_CARE_PROVIDER_SITE_OTHER): Payer: BC Managed Care – PPO | Admitting: Internal Medicine

## 2013-10-03 VITALS — BP 110/76 | HR 76 | Temp 98.3°F | Wt 103.0 lb

## 2013-10-03 DIAGNOSIS — Z0000 Encounter for general adult medical examination without abnormal findings: Secondary | ICD-10-CM

## 2013-10-03 DIAGNOSIS — E119 Type 2 diabetes mellitus without complications: Secondary | ICD-10-CM

## 2013-10-03 DIAGNOSIS — R911 Solitary pulmonary nodule: Secondary | ICD-10-CM

## 2013-10-03 DIAGNOSIS — Z136 Encounter for screening for cardiovascular disorders: Secondary | ICD-10-CM

## 2013-10-03 LAB — BASIC METABOLIC PANEL
BUN: 18 mg/dL (ref 6–23)
CO2: 32 mEq/L (ref 19–32)
CREATININE: 0.5 mg/dL (ref 0.4–1.2)
Calcium: 9.2 mg/dL (ref 8.4–10.5)
Chloride: 101 mEq/L (ref 96–112)
GFR: 123.54 mL/min (ref >60.00)
GLUCOSE: 87 mg/dL (ref 70–99)
Potassium: 4.4 mEq/L (ref 3.5–5.1)
Sodium: 136 mEq/L (ref 135–145)

## 2013-10-03 LAB — LIPID PANEL
CHOLESTEROL: 141 mg/dL (ref 0–200)
HDL: 38.9 mg/dL — AB (ref >39.00)
LDL Cholesterol: 78 mg/dL (ref 0–99)
NonHDL: 102.1
Total CHOL/HDL Ratio: 4
Triglycerides: 119 mg/dL (ref 0.0–149.0)
VLDL: 23.8 mg/dL (ref 0.0–40.0)

## 2013-10-03 LAB — CBC WITH DIFFERENTIAL/PLATELET
Basophils Absolute: 0 10*3/uL (ref 0.0–0.1)
Basophils Relative: 0.5 % (ref 0.0–3.0)
EOS PCT: 0.6 % (ref 0.0–5.0)
Eosinophils Absolute: 0 10*3/uL (ref 0.0–0.7)
HEMATOCRIT: 43.8 % (ref 36.0–46.0)
Hemoglobin: 14.8 g/dL (ref 12.0–15.0)
LYMPHS ABS: 2.3 10*3/uL (ref 0.7–4.0)
Lymphocytes Relative: 26.2 % (ref 12.0–46.0)
MCHC: 33.8 g/dL (ref 30.0–36.0)
MCV: 99.1 fl (ref 78.0–100.0)
MONOS PCT: 6.5 % (ref 3.0–12.0)
Monocytes Absolute: 0.6 10*3/uL (ref 0.1–1.0)
Neutro Abs: 5.8 10*3/uL (ref 1.4–7.7)
Neutrophils Relative %: 66.2 % (ref 43.0–77.0)
PLATELETS: 209 10*3/uL (ref 150.0–400.0)
RBC: 4.42 Mil/uL (ref 3.87–5.11)
RDW: 13.9 % (ref 11.5–15.5)
WBC: 8.7 10*3/uL (ref 4.0–10.5)

## 2013-10-03 LAB — URINALYSIS, ROUTINE W REFLEX MICROSCOPIC
Bilirubin Urine: NEGATIVE
KETONES UR: NEGATIVE
Nitrite: NEGATIVE
PH: 5.5 (ref 5.0–8.0)
Total Protein, Urine: NEGATIVE
Urine Glucose: NEGATIVE
Urobilinogen, UA: 0.2 (ref 0.0–1.0)

## 2013-10-03 LAB — HEPATIC FUNCTION PANEL
ALBUMIN: 3.6 g/dL (ref 3.5–5.2)
ALT: 18 U/L (ref 0–35)
AST: 16 U/L (ref 0–37)
Alkaline Phosphatase: 65 U/L (ref 39–117)
Bilirubin, Direct: 0 mg/dL (ref 0.0–0.3)
Total Bilirubin: 0.4 mg/dL (ref 0.2–1.2)
Total Protein: 6.5 g/dL (ref 6.0–8.3)

## 2013-10-03 LAB — MICROALBUMIN / CREATININE URINE RATIO
Creatinine,U: 29.4 mg/dL
Microalb Creat Ratio: 3.1 mg/g (ref 0.0–30.0)
Microalb, Ur: 0.9 mg/dL (ref 0.0–1.9)

## 2013-10-03 LAB — HEMOGLOBIN A1C: Hgb A1c MFr Bld: 6.4 % (ref 4.6–6.5)

## 2013-10-03 LAB — TSH: TSH: 0.07 u[IU]/mL — ABNORMAL LOW (ref 0.35–4.50)

## 2013-10-03 NOTE — Progress Notes (Signed)
Subjective:    Patient ID: [name], female    DOB: 05-27-1950, 63 y.o.   MRN: [id number]  HPI  Here for wellness and f/u;  Overall doing ok;  Pt denies CP, worsening SOB, DOE, wheezing, orthopnea, PND, worsening LE edema, palpitations, dizziness or syncope.  Pt denies neurological change such as new headache, facial or extremity weakness.  Pt denies polydipsia, polyuria, or low sugar symptoms. Pt states overall good compliance with treatment and medications, good tolerability, and has been trying to follow lower cholesterol diet.  Pt denies worsening depressive symptoms, suicidal ideation or panic. No fever, night sweats, wt loss, loss of appetite, or other constitutional symptoms.  Pt states good ability with ADL's, has low fall risk, home safety reviewed and adequate, no other significant changes in hearing or vision, and only occasionally active with exercise.  Declines immunizations, ok for mammogram and colonoscopy, and f/u ct chest for nodule.   Past Medical History  Diagnosis Date  . Hypothyroidism   . Diabetes mellitus     type 2- diet  . Depression   . Anxiety   . Hyperlipidemia   . Pneumonia 7-07  . Suicide attempt   . 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 DIABETES MELLITUS, TYPE II 02/15/2007  . Acute bronchitis 05/07/2010  . Coronary artery calcification seen on CAT scan 02/26/2012    Sept 2013   Past Surgical History  Procedure Laterality Date  . Tonsillectomy    . Appendectomy  2007  . S/p right knee surgery      reports that she has been smoking.  She has never used smokeless tobacco. She reports that she does not drink alcohol or use illicit drugs. family history includes Alcohol abuse in her father, mother, and other; Cancer in her other; Heart disease in her mother; Other in her father. Allergies  Allergen Reactions  . Fluoxetine Hcl     REACTION: rash  . Moxifloxacin     REACTION: rash   Current Outpatient Prescriptions on File Prior to Visit    Medication Sig Dispense Refill  . ACCU-CHEK AVIVA test strip USE AS DIRECTED  100 each  1  . aspirin 81 MG EC tablet Take 81 mg by mouth daily.       Marland Kitchen ibuprofen (ADVIL,MOTRIN) 200 MG tablet Take 200 mg by mouth daily as needed.        Marland Kitchen levothyroxine (SYNTHROID, LEVOTHROID) 125 MCG tablet TAKE ONE TABLET BY MOUTH EVERY DAY  90 tablet  2  . lovastatin (MEVACOR) 20 MG tablet TAKE ONE TABLET BY MOUTH EVERY DAY  90 tablet  3  . risperiDONE (RISPERDAL) 3 MG tablet Take 3 mg by mouth at bedtime.      . sertraline (ZOLOFT) 100 MG tablet Take 100 mg by mouth daily.      . temazepam (RESTORIL) 30 MG capsule TAKE ONE CAPSULE BY MOUTH AT BEDTIME AS NEEDED  30 capsule  5  . fexofenadine (ALLEGRA) 180 MG tablet Take 1 tablet (180 mg total) by mouth daily.  30 tablet  2   No current facility-administered medications on file prior to visit.   Review of Systems Constitutional: Negative for increased diaphoresis, other activity, appetite or other siginficant weight change  HENT: Negative for worsening hearing loss, ear pain, facial swelling, mouth sores and neck stiffness.   Eyes: Negative for other worsening pain, redness or visual disturbance.  Respiratory: Negative for shortness of breath and wheezing.   Cardiovascular: Negative for  chest pain and palpitations.  Gastrointestinal: Negative for diarrhea, blood in stool, abdominal distention or other pain Genitourinary: Negative for hematuria, flank pain or change in urine volume.  Musculoskeletal: Negative for myalgias or other joint complaints.  Skin: Negative for color change and wound.  Neurological: Negative for syncope and numbness. other than noted Hematological: Negative for adenopathy. or other swelling Psychiatric/Behavioral: Negative for hallucinations, self-injury, decreased concentration or other worsening agitation.      Objective:   Physical Exam BP 110/76  Pulse 76  Temp(Src) 98.3 F (36.8 C) (Oral)  Wt 103 lb (46.72 kg)  SpO2  97% VS noted,  Constitutional: Pt is oriented to person, place, and time. Appears well-developed and well-nourished.  Head: Normocephalic and atraumatic.  Right Ear: External ear normal.  Left Ear: External ear normal.  Nose: Nose normal.  Mouth/Throat: Oropharynx is clear and moist.  Eyes: Conjunctivae and EOM are normal. Pupils are equal, round, and reactive to light.  Neck: Normal range of motion. Neck supple. No JVD present. No tracheal deviation present.  Cardiovascular: Normal rate, regular rhythm, normal heart sounds and intact distal pulses.   Pulmonary/Chest: Effort normal and breath sounds without rales or wheezing  Abdominal: Soft. Bowel sounds are normal. NT. No HSM  Musculoskeletal: Normal range of motion. Exhibits no edema.  Lymphadenopathy:  Has no cervical adenopathy.  Neurological: Pt is alert and oriented to person, place, and time. Pt has normal reflexes. No cranial nerve deficit. Motor grossly intact Skin: Skin is warm and dry. No rash noted.  Psychiatric:  Has 1+ nervous mood and affect. Behavior is normal.     Assessment & Plan:

## 2013-10-03 NOTE — Progress Notes (Signed)
Pre visit review using our clinic review tool, if applicable. No additional management support is needed unless otherwise documented below in the visit note. 

## 2013-10-03 NOTE — Patient Instructions (Addendum)
Please continue all other medications as before, and refills have been done if requested.  Please have the pharmacy call with any other refills you may need.  Please continue your efforts at being more active, low cholesterol diet, and weight control.  You are otherwise up to date with prevention measures today.  Please keep your appointments with your specialists as you may have planned  You will be contacted regarding the referral for: mammogram, and colonoscopy  You will be contacted regarding the referral for: CT scan for the lung nodule (to see PCC's now)  Please go to the LAB in the Basement (turn left off the elevator) for the tests to be done today  You will be contacted by phone if any changes need to be made immediately.  Otherwise, you will receive a letter about your results with an explanation, but please check with MyChart first.  Please remember to sign up for MyChart if you have not done so, as this will be important to you in the future with finding out test results, communicating by private email, and scheduling acute appointments online when needed.  Please return in 6 months, or sooner if needed

## 2013-10-03 NOTE — Assessment & Plan Note (Signed)

## 2013-10-04 NOTE — Assessment & Plan Note (Signed)
For ct f/u nodule,  to f/u any worsening symptoms or concerns

## 2013-10-04 NOTE — Assessment & Plan Note (Signed)
stable overall by history and exam, recent data reviewed with pt, and pt to continue medical treatment as before,  to f/u any worsening symptoms or concerns Lab Results  Component Value Date   HGBA1C 6.4 10/03/2013

## 2013-10-08 ENCOUNTER — Ambulatory Visit (INDEPENDENT_AMBULATORY_CARE_PROVIDER_SITE_OTHER)
Admission: RE | Admit: 2013-10-08 | Discharge: 2013-10-08 | Disposition: A | Discharge status: Home / Self Care | Payer: BC Managed Care – PPO | Source: Ambulatory Visit | Attending: Internal Medicine | Admitting: Internal Medicine

## 2013-10-08 ENCOUNTER — Other Ambulatory Visit: Payer: Self-pay | Admitting: Internal Medicine

## 2013-10-08 ENCOUNTER — Encounter: Payer: Self-pay | Admitting: Internal Medicine

## 2013-10-08 DIAGNOSIS — R911 Solitary pulmonary nodule: Secondary | ICD-10-CM

## 2013-10-08 DIAGNOSIS — Z1231 Encounter for screening mammogram for malignant neoplasm of breast: Secondary | ICD-10-CM

## 2013-10-10 ENCOUNTER — Encounter: Payer: Self-pay | Admitting: Internal Medicine

## 2013-10-10 ENCOUNTER — Other Ambulatory Visit: Payer: Self-pay | Admitting: Internal Medicine

## 2013-10-10 DIAGNOSIS — E038 Other specified hypothyroidism: Secondary | ICD-10-CM

## 2013-10-10 MED ORDER — LEVOTHYROXINE SODIUM 100 MCG PO TABS: 100.0000 ug | ORAL_TABLET | Freq: Every day | ORAL | Status: DC

## 2013-10-15 ENCOUNTER — Telehealth: Payer: Self-pay | Admitting: Internal Medicine

## 2013-10-15 NOTE — Telephone Encounter (Signed)
Patient informed of MD instructions. 

## 2013-10-15 NOTE — Telephone Encounter (Signed)
Patient Information:  Caller Name: Miasia  Phone: [phone]  Patient: [name], [name]  Gender: Female  DOB: May 28, 1950  Age: 63 Years  PCP: Oliver Barre (Adults only)  Office Follow Up:  Does the office need to follow up with this patient?: Yes  Instructions For The Office: No appt available at the office. PLEASE CONTACT PATIENT FOR ASSISTANCE WITH APPT. SCHEDULING.  OFFICE VS ED  RN Note:  No appt available at the office. PLEASE CONTACT PATIENT FOR ASSISTANCE WITH APPT. SCHEDULING.  Symptoms  Reason For Call & Symptoms: Patient was in office on 10/03/13 for physical.  She had blood work .  Due to TSH level her Synthroid was decreased from to .   She states today , 10/15/13 she got up at 04:30 a.m.  she had coffee and took her daily medications, After above 15-30 minutes "I felt weird and a funny sensation in my throat and some dizziness" for several hours.  Now she just feels tired. Denies CP , no shortness of breath, or difficulty swallowing.. She has type II diabetes. No strips to check GBS. She states eating and drinking normally.  Reviewed Health History In EMR: Yes  Reviewed Medications In EMR: Yes  Reviewed Allergies In EMR: Yes  Reviewed Surgeries / Procedures: Yes  Date of Onset of Symptoms: 10/15/2013  Guideline(s) Used:  Dizziness  Weakness (Generalized) and Fatigue  Disposition Per Guideline:   Go to ED Now (or to Office with PCP Approval)  Reason For Disposition Reached:   Moderate weakness from poor fluid intake with no improvement after 2 hours of rest and fluids  Advice Given:  Call Back If:  Unable to stand or walk  Passes out  Breathing difficulty occurs  You become worse.  RN Overrode Recommendation:  Make Appointment  No appt available at the office. PLEASE CONTACT PATIENT FOR ASSISTANCE WITH APPT. SCHEDULING.  OFFICE VS ED

## 2013-10-15 NOTE — Telephone Encounter (Signed)
Should go to ER most likley as we cannot tx with IVF's in the office

## 2013-10-18 ENCOUNTER — Emergency Department (HOSPITAL_COMMUNITY)
Admission: EM | Admit: 2013-10-18 | Discharge: 2013-10-18 | Disposition: A | Discharge status: Home / Self Care | Payer: BC Managed Care – PPO | Attending: Emergency Medicine | Admitting: Emergency Medicine

## 2013-10-18 ENCOUNTER — Encounter (HOSPITAL_COMMUNITY): Payer: Self-pay | Admitting: Emergency Medicine

## 2013-10-18 DIAGNOSIS — R5383 Other fatigue: Secondary | ICD-10-CM

## 2013-10-18 DIAGNOSIS — Z79899 Other long term (current) drug therapy: Secondary | ICD-10-CM | POA: Diagnosis not present

## 2013-10-18 DIAGNOSIS — F17200 Nicotine dependence, unspecified, uncomplicated: Secondary | ICD-10-CM | POA: Insufficient documentation

## 2013-10-18 DIAGNOSIS — E785 Hyperlipidemia, unspecified: Secondary | ICD-10-CM | POA: Diagnosis not present

## 2013-10-18 DIAGNOSIS — Z8701 Personal history of pneumonia (recurrent): Secondary | ICD-10-CM | POA: Insufficient documentation

## 2013-10-18 DIAGNOSIS — R42 Dizziness and giddiness: Secondary | ICD-10-CM | POA: Diagnosis present

## 2013-10-18 DIAGNOSIS — Z7982 Long term (current) use of aspirin: Secondary | ICD-10-CM | POA: Diagnosis not present

## 2013-10-18 DIAGNOSIS — R5381 Other malaise: Secondary | ICD-10-CM | POA: Insufficient documentation

## 2013-10-18 DIAGNOSIS — F329 Major depressive disorder, single episode, unspecified: Secondary | ICD-10-CM | POA: Diagnosis not present

## 2013-10-18 DIAGNOSIS — F3289 Other specified depressive episodes: Secondary | ICD-10-CM | POA: Insufficient documentation

## 2013-10-18 DIAGNOSIS — E119 Type 2 diabetes mellitus without complications: Secondary | ICD-10-CM | POA: Insufficient documentation

## 2013-10-18 DIAGNOSIS — F411 Generalized anxiety disorder: Secondary | ICD-10-CM | POA: Diagnosis not present

## 2013-10-18 DIAGNOSIS — Z8709 Personal history of other diseases of the respiratory system: Secondary | ICD-10-CM | POA: Diagnosis not present

## 2013-10-18 DIAGNOSIS — E039 Hypothyroidism, unspecified: Secondary | ICD-10-CM | POA: Insufficient documentation

## 2013-10-18 DIAGNOSIS — E876 Hypokalemia: Secondary | ICD-10-CM | POA: Diagnosis not present

## 2013-10-18 LAB — I-STAT CHEM 8, ED
BUN: 13 mg/dL (ref 6–23)
CALCIUM ION: 1.1 mmol/L — AB (ref 1.13–1.30)
CHLORIDE: 100 meq/L (ref 96–112)
Creatinine, Ser: 0.6 mg/dL (ref 0.50–1.10)
GLUCOSE: 155 mg/dL — AB (ref 70–99)
HEMATOCRIT: 45 % (ref 36.0–46.0)
Hemoglobin: 15.3 g/dL — ABNORMAL HIGH (ref 12.0–15.0)
POTASSIUM: 3.5 meq/L — AB (ref 3.7–5.3)
Sodium: 138 mEq/L (ref 137–147)
TCO2: 26 mmol/L (ref 0–100)

## 2013-10-18 LAB — CBC
HCT: 42.9 % (ref 36.0–46.0)
Hemoglobin: 14.9 g/dL (ref 12.0–15.0)
MCH: 33.6 pg (ref 26.0–34.0)
MCHC: 34.7 g/dL (ref 30.0–36.0)
MCV: 96.6 fL (ref 78.0–100.0)
Platelets: 180 K/uL (ref 150–400)
RBC: 4.44 MIL/uL (ref 3.87–5.11)
RDW: 13.1 % (ref 11.5–15.5)
WBC: 8.7 K/uL (ref 4.0–10.5)

## 2013-10-18 LAB — TSH: TSH: 0.097 u[IU]/mL — AB (ref 0.350–4.500)

## 2013-10-18 LAB — CBG MONITORING, ED: GLUCOSE-CAPILLARY: 129 mg/dL — AB (ref 70–99)

## 2013-10-18 LAB — T4: T4, Total: 8.5 ug/dL (ref 4.5–12.0)

## 2013-10-18 NOTE — Discharge Instructions (Signed)
Hypokalemia °Hypokalemia means that the amount of potassium in the blood is lower than normal. Potassium is a chemical, called an electrolyte, that helps regulate the amount of fluid in the body. It also stimulates muscle contraction and helps nerves function properly. Most of the body's potassium is inside of cells, and only a very small amount is in the blood. Because the amount in the blood is so small, minor changes can be life-threatening. °CAUSES °· Antibiotics. °· Diarrhea or vomiting. °· Using laxatives too much, which can cause diarrhea. °· Chronic kidney disease. °· Water pills (diuretics). °· Eating disorders (bulimia). °· Low magnesium level. °· Sweating a lot. °SIGNS AND SYMPTOMS °· Weakness. °· Constipation. °· Fatigue. °· Muscle cramps. °· Mental confusion. °· Skipped heartbeats or irregular heartbeat (palpitations). °· Tingling or numbness. °DIAGNOSIS  °Your health care provider can diagnose hypokalemia with blood tests. In addition to checking your potassium level, your health care provider may also check other lab tests. °TREATMENT °Hypokalemia can be treated with potassium supplements taken by mouth or adjustments in your current medicines. If your potassium level is very low, you may need to get potassium through a vein (IV) and be monitored in the hospital. A diet high in potassium is also helpful. Foods high in potassium are: °· Nuts, such as peanuts and pistachios. °· Seeds, such as sunflower seeds and pumpkin seeds. °· Peas, lentils, and lima beans. °· Whole grain and bran cereals and breads. °· Fresh fruit and vegetables, such as apricots, avocado, bananas, cantaloupe, kiwi, oranges, tomatoes, asparagus, and potatoes. °· Orange and tomato juices. °· Red meats. °· Fruit yogurt. °HOME CARE INSTRUCTIONS °· Take all medicines as prescribed by your health care provider. °· Maintain a healthy diet by including nutritious food, such as fruits, vegetables, nuts, whole grains, and lean meats. °· If  you are taking a laxative, be sure to follow the directions on the label. °SEEK MEDICAL CARE IF: °· Your weakness gets worse. °· You feel your heart pounding or racing. °· You are vomiting or having diarrhea. °· You are diabetic and having trouble keeping your blood glucose in the normal range. °SEEK IMMEDIATE MEDICAL CARE IF: °· You have chest pain, shortness of breath, or dizziness. °· You are vomiting or having diarrhea for more than 2 days. °· You faint. °MAKE SURE YOU:  °· Understand these instructions. °· Will watch your condition. °· Will get help right away if you are not doing well or get worse. °Document Released: 01/16/2005 Document Revised: 11/06/2012 Document Reviewed: 07/19/2012 °Exit[employer]® Patient Information ©2015 [employer]. This information is not intended to replace advice given to you by your health care provider. Make sure you discuss any questions you have with your health care provider. ° °Potassium Content of Foods °Potassium is a mineral found in many foods and drinks. It helps keep fluids and minerals balanced in your body and affects how steadily your heart beats. Potassium also helps control your blood pressure and keep your muscles and nervous system healthy. °Certain health conditions and medicines may change the balance of potassium in your body. When this happens, you can help balance your level of potassium through the foods that you do or do not eat. Your health care provider or dietitian may recommend an amount of potassium that you should have each day. The following lists of foods provide the amount of potassium (in parentheses) per serving in each item. °HIGH IN POTASSIUM  °The following foods and beverages have 200 mg or more of potassium per   serving: °· Apricots, 2 raw or 5 dry (200 mg). °· Artichoke, 1 medium (345 mg). °· Avocado, raw,  ¼ each (245 mg). °· Banana, 1 medium (425 mg). °· Beans, lima, or baked beans, canned, ½ cup (280 mg). °· Beans, white, canned, ½ cup (595  mg). °· Beef roast, 3 oz (320 mg). °· Beef, ground, 3 oz (270 mg). °· Beets, raw or cooked, ½ cup (260 mg). °· Bran muffin, 2 oz (300 mg). °· Broccoli, ½ cup (230 mg). °· Brussels sprouts, ½ cup (250 mg). °· Cantaloupe, ½ cup (215 mg). °· Cereal, 100% bran, ½ cup (200-400 mg). °· Cheeseburger, single, fast food, 1 each (225-400 mg). °· Chicken, 3 oz (220 mg). °· Clams, canned, 3 oz (535 mg). °· Crab, 3 oz (225 mg). °· Dates, 5 each (270 mg). °· Dried beans and peas, ½ cup (300-475 mg). °· Figs, dried, 2 each (260 mg). °· Fish: halibut, tuna, cod, snapper, 3 oz (480 mg). °· Fish: salmon, haddock, swordfish, perch, 3 oz (300 mg). °· Fish, tuna, canned 3 oz (200 mg). °· French fries, fast food, 3 oz (470 mg). °· Granola with fruit and nuts, ½ cup (200 mg). °· Grapefruit juice, ½ cup (200 mg). °· Greens, beet, ½ cup (655 mg). °· Honeydew melon, ½ cup (200 mg). °· Kale, raw, 1 cup (300 mg). °· Kiwi, 1 medium (240 mg). °· Kohlrabi, rutabaga, parsnips, ½ cup (280 mg). °· Lentils, ½ cup (365 mg). °· Mango, 1 each (325 mg). °· Milk, chocolate, 1 cup (420 mg). °· Milk: nonfat, low-fat, whole, buttermilk, 1 cup (350-380 mg). °· Molasses, 1 Tbsp (295 mg). °· Mushrooms, ½ cup (280) mg. °· Nectarine, 1 each (275 mg). °· Nuts: almonds, peanuts, hazelnuts, [place], cashew, mixed, 1 oz (200 mg). °· Nuts, pistachios, 1 oz (295 mg). °· Orange, 1 each (240 mg). °· Orange juice, ½ cup (235 mg). °· Papaya, medium, ½ fruit (390 mg). °· Peanut butter, chunky, 2 Tbsp (240 mg). °· Peanut butter, smooth, 2 Tbsp (210 mg). °· Pear, 1 medium (200 mg). °· Pomegranate, 1 whole (400 mg). °· Pomegranate juice, ½ cup (215 mg). °· Pork, 3 oz (350 mg). °· Potato chips, salted, 1 oz (465 mg). °· Potato, baked with skin, 1 medium (925 mg). °· Potatoes, boiled, ½ cup (255 mg). °· Potatoes, mashed, ½ cup (330 mg). °· Prune juice, ½ cup (370 mg). °· Prunes, 5 each (305 mg). °· Pudding, chocolate, ½ cup (230 mg). °· Pumpkin, canned, ½ cup (250  mg). °· Raisins, seedless, ¼ cup (270 mg). °· Seeds, sunflower or pumpkin, 1 oz (240 mg). °· Soy milk, 1 cup (300 mg). °· Spinach, ½ cup (420 mg). °· Spinach, canned, ½ cup (370 mg). °· Sweet potato, baked with skin, 1 medium (450 mg). °· Swiss chard, ½ cup (480 mg). °· Tomato or vegetable juice, ½ cup (275 mg). °· Tomato sauce or puree, ½ cup (400-550 mg). °· Tomato, raw, 1 medium (290 mg). °· Tomatoes, canned, ½ cup (200-300 mg). °· Turkey, 3 oz (250 mg). °· Wheat germ, 1 oz (250 mg). °· Winter squash, ½ cup (250 mg). °· Yogurt, plain or fruited, 6 oz (260-435 mg). °· Zucchini, ½ cup (220 mg). °MODERATE IN POTASSIUM °The following foods and beverages have 50-200 mg of potassium per serving: °· Apple, 1 each (150 mg). °· Apple juice, ½ cup (150 mg). °· Applesauce, ½ cup (90 mg). °· Apricot nectar, ½ cup (140 mg). °· Asparagus, small spears, ½ cup or 6   spears (155 mg). °· Bagel, cinnamon raisin, 1 each (130 mg). °· Bagel, egg or plain, 4 in., 1 each (70 mg). °· Beans, green, ½ cup (90 mg). °· Beans, yellow, ½ cup (190 mg). °· Beer, regular, 12 oz (100 mg). °· Beets, canned, ½ cup (125 mg). °· Blackberries, ½ cup (115 mg). °· Blueberries, ½ cup (60 mg). °· Bread, whole wheat, 1 slice (70 mg). °· Broccoli, raw, ½ cup (145 mg). °· Cabbage, ½ cup (150 mg). °· Carrots, cooked or raw, ½ cup (180 mg). °· Cauliflower, raw, ½ cup (150 mg). °· Celery, raw, ½ cup (155 mg). °· Cereal, bran flakes, ½cup (120-150 mg). °· Cheese, cottage, ½ cup (110 mg). °· Cherries, 10 each (150 mg). °· Chocolate, 1½ oz bar (165 mg). °· Coffee, brewed 6 oz (90 mg). °· Corn, ½ cup or 1 ear (195 mg). °· Cucumbers, ½ cup (80 mg). °· Egg, large, 1 each (60 mg). °· Eggplant, ½ cup (60 mg). °· Endive, raw, ½cup (80 mg). °· English muffin, 1 each (65 mg). °· Fish, orange roughy, 3 oz (150 mg). °· Frankfurter, beef or pork, 1 each (75 mg). °· Fruit cocktail, ½ cup (115 mg). °· Grape juice, ½ cup (170 mg). °· Grapefruit, ½ fruit (175 mg). °· Grapes, ½ cup  (155 mg). °· Greens: kale, turnip, collard, ½ cup (110-150 mg). °· Ice cream or frozen yogurt, chocolate, ½ cup (175 mg). °· Ice cream or frozen yogurt, vanilla, ½ cup (120-150 mg). °· Lemons, limes, 1 each (80 mg). °· Lettuce, all types, 1 cup (100 mg). °· Mixed vegetables, ½ cup (150 mg). °· Mushrooms, raw, ½ cup (110 mg). °· Nuts: walnuts, pecans, or macadamia, 1 oz (125 mg). °· Oatmeal, ½ cup (80 mg). °· Okra, ½ cup (110 mg). °· Onions, raw, ½ cup (120 mg). °· Peach, 1 each (185 mg). °· Peaches, canned, ½ cup (120 mg). °· Pears, canned, ½ cup (120 mg). °· Peas, green, frozen, ½ cup (90 mg). °· Peppers, green, ½ cup (130 mg). °· Peppers, red, ½ cup (160 mg). °· Pineapple juice, ½ cup (165 mg). °· Pineapple, fresh or canned, ½ cup (100 mg). °· Plums, 1 each (105 mg). °· Pudding, vanilla, ½ cup (150 mg). °· Raspberries, ½ cup (90 mg). °· Rhubarb, ½ cup (115 mg). °· Rice, wild, ½ cup (80 mg). °· Shrimp, 3 oz (155 mg). °· Spinach, raw, 1 cup (170 mg). °· Strawberries, ½ cup (125 mg). °· Summer squash ½ cup (175-200 mg). °· Swiss chard, raw, 1 cup (135 mg). °· Tangerines, 1 each (140 mg). °· Tea, brewed, 6 oz (65 mg). °· Turnips, ½ cup (140 mg). °· Watermelon, ½ cup (85 mg). °· Wine, red, table, 5 oz (180 mg). °· Wine, white, table, 5 oz (100 mg). °LOW IN POTASSIUM °The following foods and beverages have less than 50 mg of potassium per serving. °· Bread, white, 1 slice (30 mg). °· Carbonated beverages, 12 oz (less than 5 mg). °· Cheese, 1 oz (20-30 mg). °· Cranberries, ½ cup (45 mg). °· Cranberry juice cocktail, ½ cup (20 mg). °· Fats and oils, 1 Tbsp (less than 5 mg). °· Hummus, 1 Tbsp (32 mg). °· Nectar: papaya, mango, or pear, ½ cup (35 mg). °· Rice, white or brown, ½ cup (50 mg). °· Spaghetti or macaroni, ½ cup cooked (30 mg). °· Tortilla, flour or corn, 1 each (50 mg). °· Waffle, 4 in., 1 each (50 mg). °· Water chestnuts, ½ cup (40 mg). °  Document Released: 08/30/2004 Document Revised: 01/21/2013 Document  Reviewed: 12/13/2012 °[employer]® Patient Information ©2015 [employer]. This information is not intended to replace advice given to you by your health care provider. Make sure you discuss any questions you have with your health care provider. ° °

## 2013-10-18 NOTE — ED Notes (Signed)
Pt states that she has been feeling dizzy and weak. Not sure if its her thyroid medications or blood sugar. Pt drove self here today. Denies n/v/d.

## 2013-10-18 NOTE — ED Notes (Signed)
Pt ambulated to restroom with steady gait.

## 2013-10-18 NOTE — ED Provider Notes (Signed)
CSN: [id number]     Arrival date & time 10/18/13  1151 History   First MD Initiated Contact with Patient 10/18/13 1222     Chief Complaint  Patient presents with  . Dizziness  . Fatigue      Patient is a 63 y.o. female presenting with dizziness.  Dizziness  Pt states that she has been feeling dizzy and weak. Not sure if its her thyroid medications or blood sugar. Pt drove self here today. Denies n/v/d.   Past Medical History  Diagnosis Date  . Hypothyroidism   . Diabetes mellitus     type 2- diet  . Depression   . Anxiety   . Hyperlipidemia   . Pneumonia 7-07  . Suicide attempt   . 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 DIABETES MELLITUS, TYPE II 02/15/2007  . Acute bronchitis 05/07/2010  . Coronary artery calcification seen on CAT scan 02/26/2012    Sept 2013   Past Surgical History  Procedure Laterality Date  . Tonsillectomy    . Appendectomy  2007  . S/p right knee surgery     Family History  Problem Relation Age of Onset  . Heart disease Mother     smoker  . Alcohol abuse Mother   . Other Father     CABG  . Alcohol abuse Father   . Alcohol abuse Other   . Cancer Other     liver   History  Substance Use Topics  . Smoking status: Current Every Day Smoker  . Smokeless tobacco: Never Used  . Alcohol Use: No   OB History   Grav Para Term Preterm Abortions TAB SAB Ect Mult Living                 Review of Systems  Neurological: Positive for dizziness.      Allergies  Fluoxetine hcl and Moxifloxacin  Home Medications   Prior to Admission medications   Medication Sig Start Date End Date Taking? Authorizing Provider  aspirin 81 MG EC tablet Take 81 mg by mouth daily.    Yes Historical Provider, MD  ibuprofen (ADVIL,MOTRIN) 200 MG tablet Take 200 mg by mouth daily as needed (osteoarthritis pain (left hip).).    Yes Historical Provider, MD  levothyroxine (SYNTHROID, LEVOTHROID) 100 MCG tablet Take 1 tablet (100 mcg total) by mouth daily.  10/10/13  Yes Corwin Levins, MD  lovastatin (MEVACOR) 20 MG tablet Take 20 mg by mouth daily.   Yes Historical Provider, MD  risperiDONE (RISPERDAL) 3 MG tablet Take 3 mg by mouth at bedtime.   Yes Historical Provider, MD  sertraline (ZOLOFT) 100 MG tablet Take 100 mg by mouth daily.   Yes Historical Provider, MD  temazepam (RESTORIL) 30 MG capsule Take 30 mg by mouth at bedtime as needed for sleep.   Yes Historical Provider, MD  fexofenadine (ALLEGRA) 180 MG tablet Take 1 tablet (180 mg total) by mouth daily. 03/31/11 03/30/12  Corwin Levins, MD   BP 113/84  Pulse 66  Temp(Src) 98.7 F (37.1 C) (Oral)  Resp 16  SpO2 96% Physical Exam  Nursing note and vitals reviewed. Constitutional: She is oriented to person, place, and time. She appears well-developed and well-nourished. No distress.  HENT:  Head: Normocephalic and atraumatic.  Eyes: Pupils are equal, round, and reactive to light.  Neck: Normal range of motion.  Cardiovascular: Normal rate and intact distal pulses.   Pulmonary/Chest: No respiratory distress.  Abdominal: Normal appearance. She exhibits  no distension. There is no tenderness. There is no rebound.  Musculoskeletal: Normal range of motion.  Neurological: She is alert and oriented to person, place, and time. No cranial nerve deficit. Coordination normal.  Skin: Skin is warm and dry. No rash noted.  Psychiatric: She has a normal mood and affect. Her behavior is normal.    ED Course  Procedures (including critical care time) Labs Review Labs Reviewed  TSH - Abnormal; Notable for the following:    TSH 0.097 (*)    All other components within normal limits  CBG MONITORING, ED - Abnormal; Notable for the following:    Glucose-Capillary 129 (*)    All other components within normal limits  I-STAT CHEM 8, ED - Abnormal; Notable for the following:    Potassium 3.5 (*)    Glucose, Bld 155 (*)    Calcium, Ion 1.10 (*)    Hemoglobin 15.3 (*)    All other components within  normal limits  CBC  T4    Imaging Review No results found.   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  Patient has no focal or lateralizing neurologic findings.  There was some slight hypokalemia.  At this time increasing potassium intake naturally is probably always needed.  Patient stable for discharge. MDM   Final diagnoses:  Hypokalemia        Nelia Shi, MD 10/19/13 [phone]

## 2013-10-18 NOTE — ED Notes (Signed)
CBG 129 

## 2013-10-18 NOTE — ED Notes (Signed)
Pt reports she is leaving with ALL belongings she arrived with. She is ambulatory upon dc.

## 2013-10-21 ENCOUNTER — Ambulatory Visit (INDEPENDENT_AMBULATORY_CARE_PROVIDER_SITE_OTHER): Payer: BC Managed Care – PPO | Admitting: Internal Medicine

## 2013-10-21 ENCOUNTER — Encounter: Payer: Self-pay | Admitting: Internal Medicine

## 2013-10-21 VITALS — BP 98/68 | HR 71 | Temp 98.2°F | Wt 102.2 lb

## 2013-10-21 DIAGNOSIS — E876 Hypokalemia: Secondary | ICD-10-CM

## 2013-10-21 DIAGNOSIS — F4321 Adjustment disorder with depressed mood: Secondary | ICD-10-CM

## 2013-10-21 DIAGNOSIS — R42 Dizziness and giddiness: Secondary | ICD-10-CM

## 2013-10-21 DIAGNOSIS — J309 Allergic rhinitis, unspecified: Secondary | ICD-10-CM

## 2013-10-21 NOTE — Progress Notes (Signed)
Pre visit review using our clinic review tool, if applicable. No additional management support is needed unless otherwise documented below in the visit note. 

## 2013-10-21 NOTE — Patient Instructions (Signed)
Please continue all other medications as before, and refills have been done if requested.  Please have the pharmacy call with any other refills you may need.  Please continue your efforts at being more active, low cholesterol diet, and weight control.  You are otherwise up to date with prevention measures today.  Please keep your appointments with your specialists as you may have planned  You can also take Delsym OTC for cough, and/or Mucinex (or it's generic off brand) for congestion, and tylenol as needed for pain., and cont the allegra D as well

## 2013-10-21 NOTE — Progress Notes (Signed)
Subjective:    Patient ID: [name], female    DOB: 04-28-1950, 63 y.o.   MRN: [id number]  HPI  Here to f/u, sad with sister's death last wk. Vertigo resolved.  Does have several wks ongoing nasal allergy symptoms with clearish congestion, itch and sneezing, without fever, pain, ST, cough, swelling or wheezing.  Did have mild low K with labs when seen in ER. No muscle  Cramps, weakness.   Pt denies fever, wt loss, night sweats, loss of appetite, or other constitutional symptoms Past Medical History  Diagnosis Date  . Hypothyroidism   . Diabetes mellitus     type 2- diet  . Depression   . Anxiety   . Hyperlipidemia   . Pneumonia 7-07  . Suicide attempt   . 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 DIABETES MELLITUS, TYPE II 02/15/2007  . Acute bronchitis 05/07/2010  . Coronary artery calcification seen on CAT scan 02/26/2012    Sept 2013   Past Surgical History  Procedure Laterality Date  . Tonsillectomy    . Appendectomy  2007  . S/p right knee surgery      reports that she has been smoking.  She has never used smokeless tobacco. She reports that she does not drink alcohol or use illicit drugs. family history includes Alcohol abuse in her father, mother, and other; Cancer in her other; Heart disease in her mother; Other in her father. Allergies  Allergen Reactions  . Fluoxetine Hcl     REACTION: rash  . Moxifloxacin     REACTION: rash   Current Outpatient Prescriptions on File Prior to Visit  Medication Sig Dispense Refill  . aspirin 81 MG EC tablet Take 81 mg by mouth daily.       Marland Kitchen ibuprofen (ADVIL,MOTRIN) 200 MG tablet Take 200 mg by mouth daily as needed (osteoarthritis pain (left hip).).       Marland Kitchen levothyroxine (SYNTHROID, LEVOTHROID) 100 MCG tablet Take 1 tablet (100 mcg total) by mouth daily.  90 tablet  3  . lovastatin (MEVACOR) 20 MG tablet Take 20 mg by mouth daily.      . risperiDONE (RISPERDAL) 3 MG tablet Take 3 mg by mouth at bedtime.      .  sertraline (ZOLOFT) 100 MG tablet Take 100 mg by mouth daily.      . temazepam (RESTORIL) 30 MG capsule Take 30 mg by mouth at bedtime as needed for sleep.      . fexofenadine (ALLEGRA) 180 MG tablet Take 1 tablet (180 mg total) by mouth daily.  30 tablet  2   No current facility-administered medications on file prior to visit.   Review of Systems  Constitutional: Negative for unusual diaphoresis or other sweats  HENT: Negative for ringing in ear Eyes: Negative for double vision or worsening visual disturbance.  Respiratory: Negative for choking and stridor.   Gastrointestinal: Negative for vomiting or other signifcant bowel change Genitourinary: Negative for hematuria or decreased urine volume.  Musculoskeletal: Negative for other MSK pain or swelling Skin: Negative for color change and worsening wound.  Neurological: Negative for tremors and numbness other than noted  Psychiatric/Behavioral: Negative for decreased concentration or agitation other than above       Objective:   Physical Exam BP 98/68  Pulse 71  Temp(Src) 98.2 F (36.8 C) (Oral)  Wt 102 lb 4 oz (46.38 kg)  SpO2 92% VS noted,  Constitutional: Pt appears well-developed, well-nourished.  HENT: Head: NCAT.  Right Ear: External ear normal.  Left Ear: External ear normal.  Eyes: . Pupils are equal, round, and reactive to light. Conjunctivae and EOM are normal Neck: Normal range of motion. Neck supple.  Cardiovascular: Normal rate and regular rhythm.   Pulmonary/Chest: Effort normal and breath sounds normal.  Neurological: Pt is alert. Not confused , motor grossly intact Skin: Skin is warm. No rash Psychiatric: Pt behavior is normal. No agitation. sad affect today    Assessment & Plan:   Wt Readings from Last 3 Encounters:  10/21/13 102 lb 4 oz (46.38 kg)  10/03/13 103 lb (46.72 kg)  01/08/13 104 lb 8 oz (47.401 kg)

## 2013-10-24 ENCOUNTER — Ambulatory Visit
Admission: RE | Admit: 2013-10-24 | Discharge: 2013-10-24 | Disposition: A | Discharge status: Home / Self Care | Payer: BC Managed Care – PPO | Source: Ambulatory Visit | Attending: Internal Medicine | Admitting: Internal Medicine

## 2013-10-24 DIAGNOSIS — Z1231 Encounter for screening mammogram for malignant neoplasm of breast: Secondary | ICD-10-CM

## 2013-10-26 DIAGNOSIS — E876 Hypokalemia: Secondary | ICD-10-CM | POA: Insufficient documentation

## 2013-10-26 DIAGNOSIS — F4321 Adjustment disorder with depressed mood: Secondary | ICD-10-CM | POA: Insufficient documentation

## 2013-10-26 NOTE — Assessment & Plan Note (Signed)
Resolved per pt,  to f/u any worsening symptoms or concerns 

## 2013-10-26 NOTE — Assessment & Plan Note (Signed)
Mild, pt declines f/u lab today

## 2013-10-26 NOTE — Assessment & Plan Note (Signed)
Pt declines cousneling referral, verified no SI

## 2013-10-26 NOTE — Assessment & Plan Note (Signed)
Mild to mod, for otc allegra d prn,  to f/u any worsening symptoms or concerns

## 2013-11-11 ENCOUNTER — Encounter: Payer: Self-pay | Admitting: Internal Medicine

## 2014-01-09 ENCOUNTER — Ambulatory Visit (INDEPENDENT_AMBULATORY_CARE_PROVIDER_SITE_OTHER): Payer: BC Managed Care – PPO | Admitting: Internal Medicine

## 2014-01-09 VITALS — BP 108/74 | HR 71 | Temp 97.8°F | Resp 12 | Ht 61.0 in | Wt 105.0 lb

## 2014-01-09 DIAGNOSIS — K140 Glossitis: Secondary | ICD-10-CM

## 2014-01-09 MED ORDER — DIPHENHYDRAMINE HCL 25 MG PO TABS: 25.0000 mg | ORAL_TABLET | Freq: Four times a day (QID) | ORAL | Status: AC | PRN

## 2014-01-09 NOTE — Assessment & Plan Note (Signed)
Does not appear to be angioedema. No obstruction of the throat or airway and no swelling of the face or lips. No medications that could cause angioedema, no hereditary family history. She denies any new foods or food allergies. Advised Benadryl, going back on her Allegra. No stigmata of scalloping which could be indicative of amyloid. Advised patient if she has any worsening of the swelling, any breathing difficulties she should seek immediate emergent medical care.

## 2014-01-09 NOTE — Progress Notes (Signed)
   Subjective:    Patient ID: Cynthia HeraldMarta W Chuong, female    DOB: 03/12/1950, 63 y.o.   MRN: [id number]  HPI Patient is a 63 year old female comes in today for an acute visit for feeling like her tongue is bigger over the last 1-2 weeks. She did not notice it growing acutely however feels that it's taking a more room in her mouth. She denies any problems breathing, allergies to medications or foods. She denies eating any new foods recently or going out to eat. She has not been taking her allergy medication recently and has not tried anything for the tongue swelling. She denies any fevers, chills, muscle aches, pains. She denies any recent changes to her medications.  Review of Systems  Constitutional: Negative for fever, chills, diaphoresis, activity change, appetite change, fatigue and unexpected weight change.  HENT: Negative for congestion, dental problem, drooling, ear discharge, ear pain, facial swelling, hearing loss, mouth sores, nosebleeds, postnasal drip, rhinorrhea, sinus pressure, sneezing, sore throat, tinnitus, trouble swallowing and voice change.        Tongue larger  Eyes: Negative.   Respiratory: Negative for cough, chest tightness, shortness of breath, wheezing and stridor.   Cardiovascular: Negative for chest pain, palpitations and leg swelling.  Gastrointestinal: Negative.   Musculoskeletal: Negative.   Skin: Negative.   Neurological: Negative.       Objective:   Physical Exam  Constitutional: She is oriented to person, place, and time. She appears well-developed and well-nourished. No distress.  HENT:  Head: Normocephalic and atraumatic.  Right Ear: External ear normal.  Left Ear: External ear normal.  Nose: Nose normal.  Mouth/Throat: Oropharynx is clear and moist.  Unclear if tongue is swollen, no scalloping from the teeth making significant swelling unlikely. Not obstructing the airway at all.   Eyes: EOM are normal. Pupils are equal, round, and reactive to light.    Neck: Normal range of motion. Neck supple.  Question mild sub mandibular lymphadenopathy. No significant soft tissue swelling in the neck.  Cardiovascular: Normal rate and regular rhythm.   Pulmonary/Chest: Effort normal and breath sounds normal. No respiratory distress. She has no wheezes. She has no rales.  No stridor sounds  Abdominal: Soft. Bowel sounds are normal. She exhibits no distension. There is no tenderness. There is no rebound.  Lymphadenopathy:    She has cervical adenopathy.  Neurological: She is alert and oriented to person, place, and time.   Filed Vitals:   01/09/14 1119  BP: 108/74  Pulse: 71  Temp: 97.8 F (36.6 C)  TempSrc: Oral  Resp: 12  Height: 5\' 1"  (1.549 m)  Weight: 105 lb (47.628 kg)  SpO2: 95%      Assessment & Plan:

## 2014-01-09 NOTE — Progress Notes (Signed)
Pre visit review using our clinic review tool, if applicable. No additional management support is needed unless otherwise documented below in the visit note. 

## 2014-01-09 NOTE — Patient Instructions (Signed)
We will have you take some Benadryl to see if this helps with your tongue size. I would also recommend going back on your allergy medicine and Allegra.  If your tongue is no better on Monday please call us back.  Food Allergy A food allergy occurs from eating something you are sensitive to. Food allergies occur in all age groups. It may be passed to you from your parents (heredity).  CAUSES  Some common causes are cow's milk, seafood, eggs, nuts (including peanut butter), wheat, and soybeans. SYMPTOMS  Common problems are:   Swelling around the mouth.  An itchy, red rash.  Hives.  Vomiting.  Diarrhea. Severe allergic reactions are life-threatening. This reaction is called anaphylaxis. It can cause the mouth and throat to swell. This makes it hard to breathe and swallow. In severe reactions, only a small amount of food may be fatal within seconds. HOME CARE INSTRUCTIONS   If you are unsure what caused the reaction, keep a diary of foods eaten and symptoms that followed. Avoid foods that cause reactions.  If hives or rash are present:  Take medicines as directed.  Use an over-the-counter antihistamine (diphenhydramine) to treat hives and itching as needed.  Apply cold compresses to the skin or take baths in cool water. Avoid hot baths or showers. These will increase the redness and itching.  If you are severely allergic:  Hospitalization is often required following a severe reaction.  Wear a medical alert bracelet or necklace that describes the allergy.  Carry your anaphylaxis kit or epinephrine injection with you at all times. Both you and your family members should know how to use this. This can be lifesaving if you have a severe reaction. If epinephrine is used, it is important for you to seek immediate medical care or call your local emergency services (911 in [place] When the epinephrine wears off, it can be followed by a delayed reaction, which can be fatal.  Replace your  epinephrine immediately after use in case of another reaction.  Ask your caregiver for instructions if you have not been taught how to use an epinephrine injection.  Do not drive until medicines used to treat the reaction have worn off, unless approved by your caregiver. SEEK MEDICAL CARE IF:   You suspect a food allergy. Symptoms generally happen within 30 minutes of eating a food.  Your symptoms have not gone away within 2 days. See your caregiver sooner if symptoms are getting worse.  You develop new symptoms.  You want to retest yourself with a food or drink you think causes an allergic reaction. Never do this if an anaphylactic reaction to that food or drink has happened before.  There is a return of the symptoms which brought you to your caregiver. SEEK IMMEDIATE MEDICAL CARE IF:   You have trouble breathing, are wheezing, or you have a tight feeling in your chest or throat.  You have a swollen mouth, or you have hives, swelling, or itching all over your body. Use your epinephrine injection immediately. This is given into the outside of your thigh, deep into the muscle. Following use of the epinephrine injection, seek help right away. Seek immediate medical care or call your local emergency services (911 in [place] MAKE SURE YOU:   Understand these instructions.  Will watch your condition.  Will get help right away if you are not doing well or get worse. Document Released: 01/14/2000 Document Revised: 04/10/2011 Document Reviewed: 09/05/2007 [facility] Patient Information 2015 [place], [place]. This  information is not intended to replace advice given to you by your health care provider. Make sure you discuss any questions you have with your health care provider.

## 2014-01-13 ENCOUNTER — Encounter (HOSPITAL_COMMUNITY): Payer: Self-pay | Admitting: Emergency Medicine

## 2014-01-13 ENCOUNTER — Emergency Department (HOSPITAL_COMMUNITY)
Admission: EM | Admit: 2014-01-13 | Discharge: 2014-01-13 | Disposition: A | Discharge status: Home / Self Care | Payer: BC Managed Care – PPO | Attending: Emergency Medicine | Admitting: Emergency Medicine

## 2014-01-13 DIAGNOSIS — E039 Hypothyroidism, unspecified: Secondary | ICD-10-CM | POA: Diagnosis not present

## 2014-01-13 DIAGNOSIS — Z7982 Long term (current) use of aspirin: Secondary | ICD-10-CM | POA: Diagnosis not present

## 2014-01-13 DIAGNOSIS — Z79899 Other long term (current) drug therapy: Secondary | ICD-10-CM | POA: Insufficient documentation

## 2014-01-13 DIAGNOSIS — E785 Hyperlipidemia, unspecified: Secondary | ICD-10-CM | POA: Diagnosis not present

## 2014-01-13 DIAGNOSIS — F419 Anxiety disorder, unspecified: Secondary | ICD-10-CM | POA: Insufficient documentation

## 2014-01-13 DIAGNOSIS — F329 Major depressive disorder, single episode, unspecified: Secondary | ICD-10-CM | POA: Insufficient documentation

## 2014-01-13 DIAGNOSIS — E119 Type 2 diabetes mellitus without complications: Secondary | ICD-10-CM | POA: Insufficient documentation

## 2014-01-13 DIAGNOSIS — Z8701 Personal history of pneumonia (recurrent): Secondary | ICD-10-CM | POA: Insufficient documentation

## 2014-01-13 DIAGNOSIS — R221 Localized swelling, mass and lump, neck: Secondary | ICD-10-CM

## 2014-01-13 DIAGNOSIS — Z791 Long term (current) use of non-steroidal anti-inflammatories (NSAID): Secondary | ICD-10-CM | POA: Diagnosis not present

## 2014-01-13 DIAGNOSIS — Z720 Tobacco use: Secondary | ICD-10-CM | POA: Diagnosis not present

## 2014-01-13 DIAGNOSIS — Z8709 Personal history of other diseases of the respiratory system: Secondary | ICD-10-CM | POA: Diagnosis not present

## 2014-01-13 DIAGNOSIS — R220 Localized swelling, mass and lump, head: Secondary | ICD-10-CM | POA: Diagnosis present

## 2014-01-13 DIAGNOSIS — I2510 Atherosclerotic heart disease of native coronary artery without angina pectoris: Secondary | ICD-10-CM | POA: Diagnosis not present

## 2014-01-13 MED ORDER — DIPHENHYDRAMINE HCL 50 MG/ML IJ SOLN
25.0000 mg | Freq: Once | INTRAMUSCULAR | Status: AC
  Administered 2014-01-13: 25 mg via INTRAVENOUS
  Filled 2014-01-13: qty 1

## 2014-01-13 MED ORDER — FAMOTIDINE IN NACL 20-0.9 MG/50ML-% IV SOLN
20.0000 mg | Freq: Once | INTRAVENOUS | Status: AC
  Administered 2014-01-13: 20 mg via INTRAVENOUS
  Filled 2014-01-13: qty 50

## 2014-01-13 MED ORDER — METHYLPREDNISOLONE SODIUM SUCC 125 MG IJ SOLR
125.0000 mg | Freq: Once | INTRAMUSCULAR | Status: AC
  Administered 2014-01-13: 125 mg via INTRAVENOUS
  Filled 2014-01-13: qty 2

## 2014-01-13 NOTE — ED Provider Notes (Signed)
Patient care assumed at shift change. Patient is a 63 year old female presenting with tounge swelling, no known culprit. Patient was treated with Benadryl, Pepcid, Solu-Medrol.  Plan to reevaluate around 6 and likely discharge home. No airway compromise. 1735: Reevaluation patient resting comfortably in room. Denies resolution of symptoms. Patient reports onset of throat swelling approximately one week ago denies voice change, dental pain, fever, stridor, wheezing, dyspnea.  Exam shows swelling to proximal neck, proximal to thyroid, nontender, no erythema, no lymphadenopathy. No stridor. Discussed with Dr. Criss AlvineGoldston who also evaluated the patient given atypical swelling. Plan to discharge home with PCP follow-up, ENT, Benadryl as previously prescribed.   Mellody DrownLauren Chelisa Hennen, PA-C 01/14/14 [id number]  Audree CamelScott T Goldston, MD 01/14/14 [phone]

## 2014-01-13 NOTE — ED Provider Notes (Signed)
CSN: [id number]     Arrival date & time 01/13/14  1254 History   First MD Initiated Contact with Patient 01/13/14 1327     Chief Complaint  Patient presents with  . Oral Swelling    x1 week, intermittent     (Consider location/radiation/quality/duration/timing/severity/associated sxs/prior Treatment) HPI Comments: Patient is a 63 year old female past medical history significant for anxiety, hyperlipidemia, type 2 diabetes controlled with diet and exercise, depression presenting to the emergency department for one week of intermittent oral swelling. Patient states she has been seen by her PCP and advised to take Benadryl. She states she only took one days worth of the Benadryl. She states today she felt her tongue gets swollen had difficulty swallowing which prompted her to come to the emergency department. She has not taken any medications at home. She denies any rash, nausea, vomiting, drooling, chest tightness, shortness of breath. No known food allergies, no exposures to drug allergens.   Past Medical History  Diagnosis Date  . Hypothyroidism   . Diabetes mellitus     type 2- diet  . Depression   . Anxiety   . Hyperlipidemia   . Pneumonia 7-07  . Suicide attempt   . 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 DIABETES MELLITUS, TYPE II 02/15/2007  . Acute bronchitis 05/07/2010  . Coronary artery calcification seen on CAT scan 02/26/2012    Sept 2013   Past Surgical History  Procedure Laterality Date  . Tonsillectomy    . Appendectomy  2007  . S/p right knee surgery     Family History  Problem Relation Age of Onset  . Heart disease Mother     smoker  . Alcohol abuse Mother   . Other Father     CABG  . Alcohol abuse Father   . Alcohol abuse Other   . Cancer Other     liver   History  Substance Use Topics  . Smoking status: Current Every Day Smoker  . Smokeless tobacco: Never Used  . Alcohol Use: No   OB History    No data available     Review of Systems   HENT: Positive for trouble swallowing.        Tongue swelling  All other systems reviewed and are negative.     Allergies  Fluoxetine hcl and Moxifloxacin  Home Medications   Prior to Admission medications   Medication Sig Start Date End Date Taking? Authorizing Provider  aspirin 81 MG EC tablet Take 81 mg by mouth daily.    Yes Historical Provider, MD  diphenhydrAMINE (BENADRYL) 25 MG tablet Take 1 tablet (25 mg total) by mouth every 6 (six) hours as needed. 01/09/14  Yes Judie BonusElizabeth A Kollar, MD  fexofenadine (ALLEGRA) 180 MG tablet Take 1 tablet (180 mg total) by mouth daily. Patient taking differently: Take 180 mg by mouth daily as needed for allergies.  03/31/11 01/13/14 Yes Corwin LevinsJames W John, MD  ibuprofen (ADVIL,MOTRIN) 200 MG tablet Take 200 mg by mouth daily as needed (osteoarthritis pain (left hip).).    Yes Historical Provider, MD  levothyroxine (SYNTHROID, LEVOTHROID) 100 MCG tablet Take 1 tablet (100 mcg total) by mouth daily. 10/10/13  Yes Corwin LevinsJames W John, MD  lovastatin (MEVACOR) 20 MG tablet Take 20 mg by mouth daily.   Yes Historical Provider, MD  risperiDONE (RISPERDAL) 3 MG tablet Take 3 mg by mouth at bedtime.   Yes Historical Provider, MD  sertraline (ZOLOFT) 50 MG tablet Take 50 mg by  mouth daily.   Yes Historical Provider, MD  temazepam (RESTORIL) 30 MG capsule Take 30 mg by mouth at bedtime as needed for sleep.   Yes Historical Provider, MD   BP 133/89 mmHg  Pulse 64  Temp(Src) 99 F (37.2 C) (Oral)  Resp 11  Ht 5\' 1"  (1.549 m)  Wt 105 lb (47.628 kg)  BMI 19.85 kg/m2  SpO2 96% Physical Exam  Constitutional: She is oriented to person, place, and time. She appears well-developed and well-nourished. No distress.  HENT:  Head: Normocephalic and atraumatic.  Right Ear: External ear normal.  Left Ear: External ear normal.  Nose: Nose normal.  Mouth/Throat: Uvula is midline, oropharynx is clear and moist and mucous membranes are normal. No oropharyngeal exudate,  posterior oropharyngeal edema, posterior oropharyngeal erythema or tonsillar abscesses.  Tongue swelling  Eyes: Conjunctivae are normal.  Neck: Normal range of motion. Neck supple.  Cardiovascular: Normal rate, regular rhythm and normal heart sounds.   Pulmonary/Chest: Effort normal and breath sounds normal. No respiratory distress. She has no wheezes.  Abdominal: Soft. Bowel sounds are normal. There is no tenderness.  Musculoskeletal: Normal range of motion.  Lymphadenopathy:    She has no cervical adenopathy.  Neurological: She is alert and oriented to person, place, and time.  Skin: Skin is warm and dry. No rash noted. She is not diaphoretic.  Psychiatric: She has a normal mood and affect.  Nursing note and vitals reviewed.   ED Course  Procedures (including critical care time) Medications  diphenhydrAMINE (BENADRYL) injection 25 mg (25 mg Intravenous Given 01/13/14 1412)  famotidine (PEPCID) IVPB 20 mg (0 mg Intravenous Stopped 01/13/14 1442)  methylPREDNISolone sodium succinate (SOLU-MEDROL) 125 mg/2 mL injection 125 mg (125 mg Intravenous Given 01/13/14 1411)    Labs Review Labs Reviewed - No data to display  Imaging Review No results found.   EKG Interpretation None      MDM   Final diagnoses:  Allergic reaction, initial encounter    Filed Vitals:   01/13/14 1459  BP: 133/89  Pulse: 64  Temp: 99 F (37.2 C)  Resp: 11   Afebrile, NAD, non-toxic appearing, AAOx4.   Patient re-evaluated prior to shift change, is hemodynamically stable, in no respiratory distress, and denies the feeling of throat closing. Will sign out to Mellody DrownLauren Parker, PA-C pending re-evaluation with disposition pending on re-evaluation.    Jeannetta EllisJennifer L Shamaine Mulkern, PA-C 01/13/14 1558  Arby BarretteMarcy Pfeiffer, MD 01/16/14 2107

## 2014-01-13 NOTE — ED Notes (Signed)
Pt c/o dry mouth. This RN used swabs for mouth.

## 2014-01-13 NOTE — ED Notes (Signed)
Pt A+Ox4, reports tongue and throat swelling x1 week, intermittently, pt reports seen by PCP and given benadryl for allergies which she reports has improved her symptoms.  Pt reports feeling like she is having trouble swallowing today, which is why she is in the ER, but also reports the swelling is improved.  Pt denies CP/SOB.  Pt speaking full/clear sentences, tongue mildly swollen.  No difficulty clearing secretions or maintaining airway.  Skin PWD.  MAEI.  NAD.

## 2014-01-13 NOTE — ED Notes (Signed)
Patient comes from home where she lives alone.  Patient states last week she noticed that her tongue was swollen.  She went to see her PCP and was instructed to take Allegra and Benadryl.  Patient states she took one Benadryl, but did not take the Allegra D.  Patient states the swelling seem to have abated until today.  Today patient states she noticed she was having trouble swallowing and her chest feels heavy.  Patient presents with edema of oropharynx and under chin bilaterally.  Lung sounds are clear in all fields.  No wheezing or stridor noted.  Patient denies N/V/D, fever, SOB and pain.

## 2014-01-13 NOTE — Discharge Instructions (Signed)
Call for a follow up appointment with a Family or Primary Care Provider.  Call an ear, nose, throat specialist for further evaluation of your throat swelling. Take Benadryl as previously prescribed by your primary care doctor. Return if Symptoms worsen.   Take medication as prescribed.

## 2014-01-20 ENCOUNTER — Encounter: Payer: Self-pay | Admitting: Internal Medicine

## 2014-01-20 ENCOUNTER — Ambulatory Visit (INDEPENDENT_AMBULATORY_CARE_PROVIDER_SITE_OTHER): Payer: BC Managed Care – PPO | Admitting: Internal Medicine

## 2014-01-20 VITALS — BP 120/78 | HR 86 | Temp 97.9°F | Ht 61.0 in | Wt 108.5 lb

## 2014-01-20 DIAGNOSIS — E119 Type 2 diabetes mellitus without complications: Secondary | ICD-10-CM

## 2014-01-20 DIAGNOSIS — I10 Essential (primary) hypertension: Secondary | ICD-10-CM

## 2014-01-20 DIAGNOSIS — R220 Localized swelling, mass and lump, head: Secondary | ICD-10-CM

## 2014-01-20 MED ORDER — METHYLPREDNISOLONE 4 MG PO KIT: PACK | ORAL | Status: AC

## 2014-01-20 NOTE — Patient Instructions (Signed)
Please take all new medication as prescribed - the medrol pack  Please continue all other medications as before, and refills have been done if requested.  Please have the pharmacy call with any other refills you may need.  Please keep your appointments with your specialists as you may have planned

## 2014-01-20 NOTE — Assessment & Plan Note (Addendum)
With post pharynx and bilat neck discomfort, mild tongue persistent swelling - likely angioedema, pt curently afeb, responded fairly well to recent tx with solumedrol et al, but still somewhat persistent today; ok for medrol pack x 1; if resolved can likely cancel ENT appt

## 2014-01-20 NOTE — Progress Notes (Signed)
Pre visit review using our clinic review tool, if applicable. No additional management support is needed unless otherwise documented below in the visit note. 

## 2014-01-20 NOTE — Progress Notes (Signed)
Subjective:    Patient ID: [name], female    DOB: 06/10/1950, 63 y.o.   MRN: [id number]  HPI  Here to f/u though improved, seen in ED dec 15, Patient care assumed at shift change. Patient is a 63 year old female presenting with tounge swelling, no known culprit. Patient was treated with Benadryl, Pepcid, Solu-Medrol. Plan to reevaluate around 6 and likely discharge home. No airway compromise.  Since treatment, no worsening HA, sinus symptoms, tongue/lip swelling and Pt denies chest pain, increased sob or doe, wheezing, orthopnea, PND, increased LE swelling, palpitations, dizziness or syncope. Still some tongue swelling though improved Has gained 3 lbs with better diet.  Assoc with vague dull persistent discomfort to post neck and pharynx. No fever.  Past Medical History  Diagnosis Date  . Hypothyroidism   . Diabetes mellitus     type 2- diet  . Depression   . Anxiety   . Hyperlipidemia   . Pneumonia 7-07  . Suicide attempt   . 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 DIABETES MELLITUS, TYPE II 02/15/2007  . Acute bronchitis 05/07/2010  . Coronary artery calcification seen on CAT scan 02/26/2012    Sept 2013   Past Surgical History  Procedure Laterality Date  . Tonsillectomy    . Appendectomy  2007  . S/p right knee surgery      reports that she has been smoking.  She has never used smokeless tobacco. She reports that she does not drink alcohol or use illicit drugs. family history includes Alcohol abuse in her father, mother, and other; Cancer in her other; Heart disease in her mother; Other in her father. Allergies  Allergen Reactions  . Fluoxetine Hcl     REACTION: rash  . Moxifloxacin     REACTION: rash   Current Outpatient Prescriptions on File Prior to Visit  Medication Sig Dispense Refill  . aspirin 81 MG EC tablet Take 81 mg by mouth daily.     . diphenhydrAMINE (BENADRYL) 25 MG tablet Take 1 tablet (25 mg total) by mouth every 6 (six) hours as needed.  30 tablet 0  . ibuprofen (ADVIL,MOTRIN) 200 MG tablet Take 200 mg by mouth daily as needed (osteoarthritis pain (left hip).).     Marland Kitchen. levothyroxine (SYNTHROID, LEVOTHROID) 100 MCG tablet Take 1 tablet (100 mcg total) by mouth daily. 90 tablet 3  . lovastatin (MEVACOR) 20 MG tablet Take 20 mg by mouth daily.    . risperiDONE (RISPERDAL) 3 MG tablet Take 3 mg by mouth at bedtime.    . sertraline (ZOLOFT) 50 MG tablet Take 50 mg by mouth daily.    . temazepam (RESTORIL) 30 MG capsule Take 30 mg by mouth at bedtime as needed for sleep.    . fexofenadine (ALLEGRA) 180 MG tablet Take 1 tablet (180 mg total) by mouth daily. (Patient taking differently: Take 180 mg by mouth daily as needed for allergies. ) 30 tablet 2   No current facility-administered medications on file prior to visit.    Review of Systems  Constitutional: Negative for unusual diaphoresis or other sweats  HENT: Negative for ringing in ear Eyes: Negative for double vision or worsening visual disturbance.  Respiratory: Negative for choking and stridor.   Gastrointestinal: Negative for vomiting or other signifcant bowel change Genitourinary: Negative for hematuria or decreased urine volume.  Musculoskeletal: Negative for other MSK pain or swelling Skin: Negative for color change and worsening wound.  Neurological: Negative for tremors and numbness other  than noted  Psychiatric/Behavioral: Negative for decreased concentration or agitation other than above       Objective:   Physical Exam BP 120/78 mmHg  Pulse 86  Temp(Src) 97.9 F (36.6 C) (Oral)  Ht 5\' 1"  (1.549 m)  Wt 108 lb 8 oz (49.215 kg)  BMI 20.51 kg/m2  SpO2 94% VS noted, not ill appearing Constitutional: Pt appears well-developed, well-nourished.  HENT: Head: NCAT.  Right Ear: External ear normal.  Left Ear: External ear normal.  Eyes: . Pupils are equal, round, and reactive to light. Conjunctivae and EOM are normal ENT:  Mild tongue swelling persists, no other  erythema, ulcer, rash or sweling Neck: Normal range of motion. Neck supple.  Cardiovascular: Normal rate and regular rhythm.   Pulmonary/Chest: Effort normal and breath sounds without rales or wheezing.  Neurological: Pt is alert. Not confused , motor grossly intact Skin: Skin is warm. No rash Psychiatric: Pt behavior is normal. No agitation.     Assessment & Plan:

## 2014-01-21 ENCOUNTER — Telehealth: Payer: Self-pay | Admitting: Internal Medicine

## 2014-01-21 NOTE — Telephone Encounter (Signed)
emmi mailed  °

## 2014-01-29 NOTE — Assessment & Plan Note (Signed)
stable overall by history and exam, recent data reviewed with pt, and pt to continue medical treatment as before,  to f/u any worsening symptoms or concerns BP Readings from Last 3 Encounters:  01/20/14 120/78  01/13/14 121/80  01/09/14 108/74

## 2014-01-29 NOTE — Assessment & Plan Note (Signed)
stable overall by history and exam, recent data reviewed with pt, and pt to continue medical treatment as before,  to f/u any worsening symptoms or concerns Lab Results  Component Value Date   HGBA1C 6.4 10/03/2013   To call for onset polys or cbg > 200 with steroid tx

## 2014-03-18 ENCOUNTER — Other Ambulatory Visit: Payer: Self-pay | Admitting: Internal Medicine

## 2014-03-19 ENCOUNTER — Telehealth: Payer: Self-pay | Admitting: Internal Medicine

## 2014-03-19 DIAGNOSIS — R259 Unspecified abnormal involuntary movements: Secondary | ICD-10-CM

## 2014-03-19 DIAGNOSIS — G2401 Drug induced subacute dyskinesia: Secondary | ICD-10-CM

## 2014-03-19 NOTE — Telephone Encounter (Signed)
Received call per ENT today - ? Tardive dyskinesia concern to account for her mouth and tongue swelling symptoms  OK for refer Neurology  Fleet Contrasachel to let pt know

## 2014-03-20 NOTE — Telephone Encounter (Signed)
Referral placed.

## 2014-03-20 NOTE — Addendum Note (Signed)
Addended by: Kern ReapVEREEN, Malone Admire B on: 03/20/2014 09:19 AM   Modules accepted: Orders

## 2014-03-31 ENCOUNTER — Encounter: Payer: Self-pay | Admitting: Internal Medicine

## 2014-04-03 ENCOUNTER — Ambulatory Visit: Payer: BC Managed Care – PPO | Admitting: Internal Medicine

## 2014-09-17 ENCOUNTER — Other Ambulatory Visit: Payer: Self-pay | Admitting: Internal Medicine

## 2014-10-02 ENCOUNTER — Other Ambulatory Visit: Payer: Self-pay | Admitting: Internal Medicine

## 2015-03-11 IMAGING — MG MM DIGITAL SCREENING BILAT W/ CAD
4 series · 4 of 4 positions shown · non-contrast
Comparison: None.

CLINICAL DATA: Screening.

EXAM:
DIGITAL SCREENING BILATERAL MAMMOGRAM WITH CAD

[R CC]
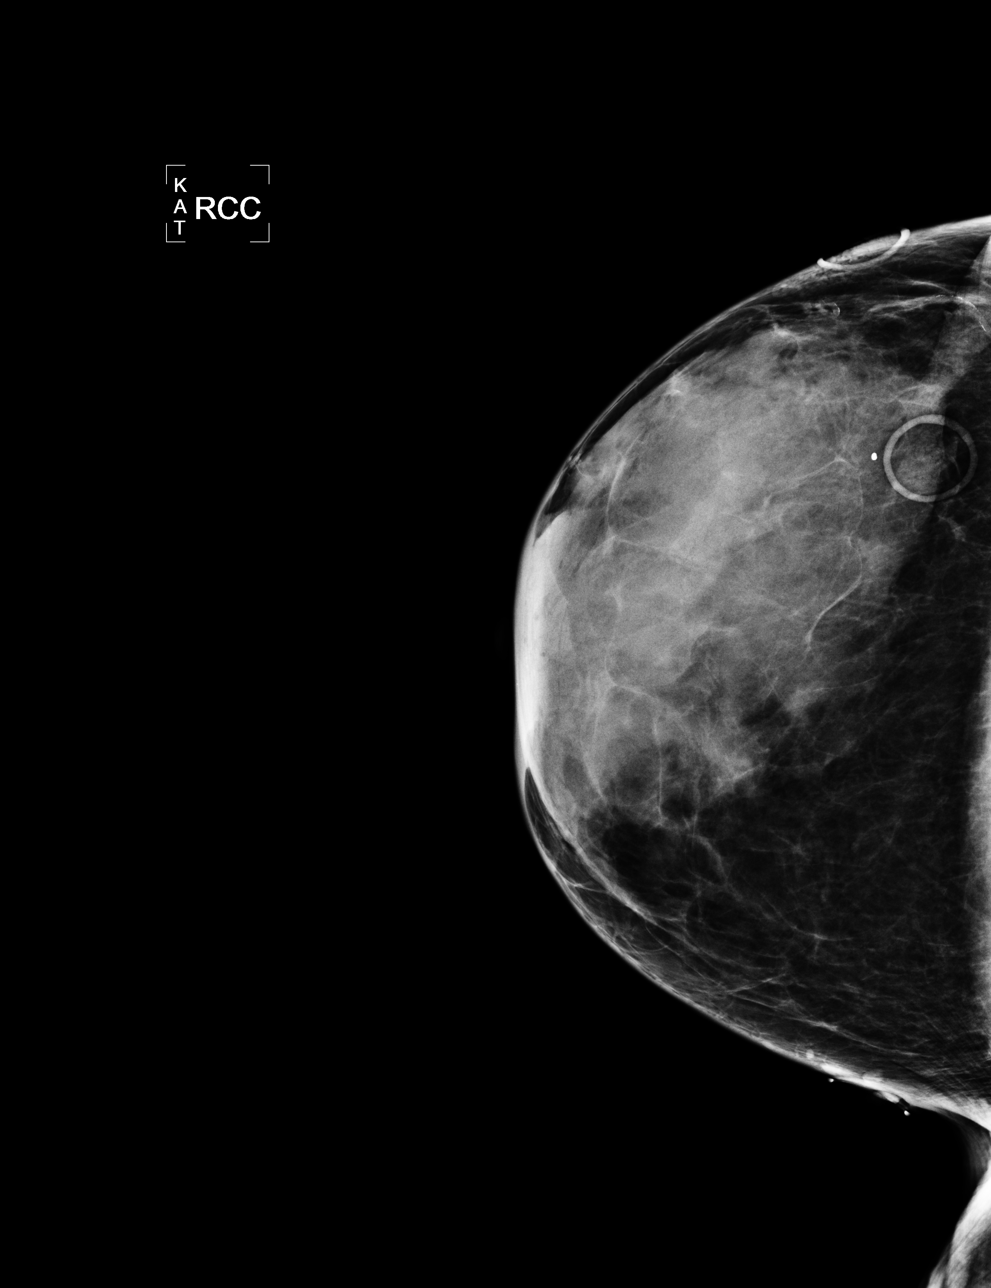

[L CC]
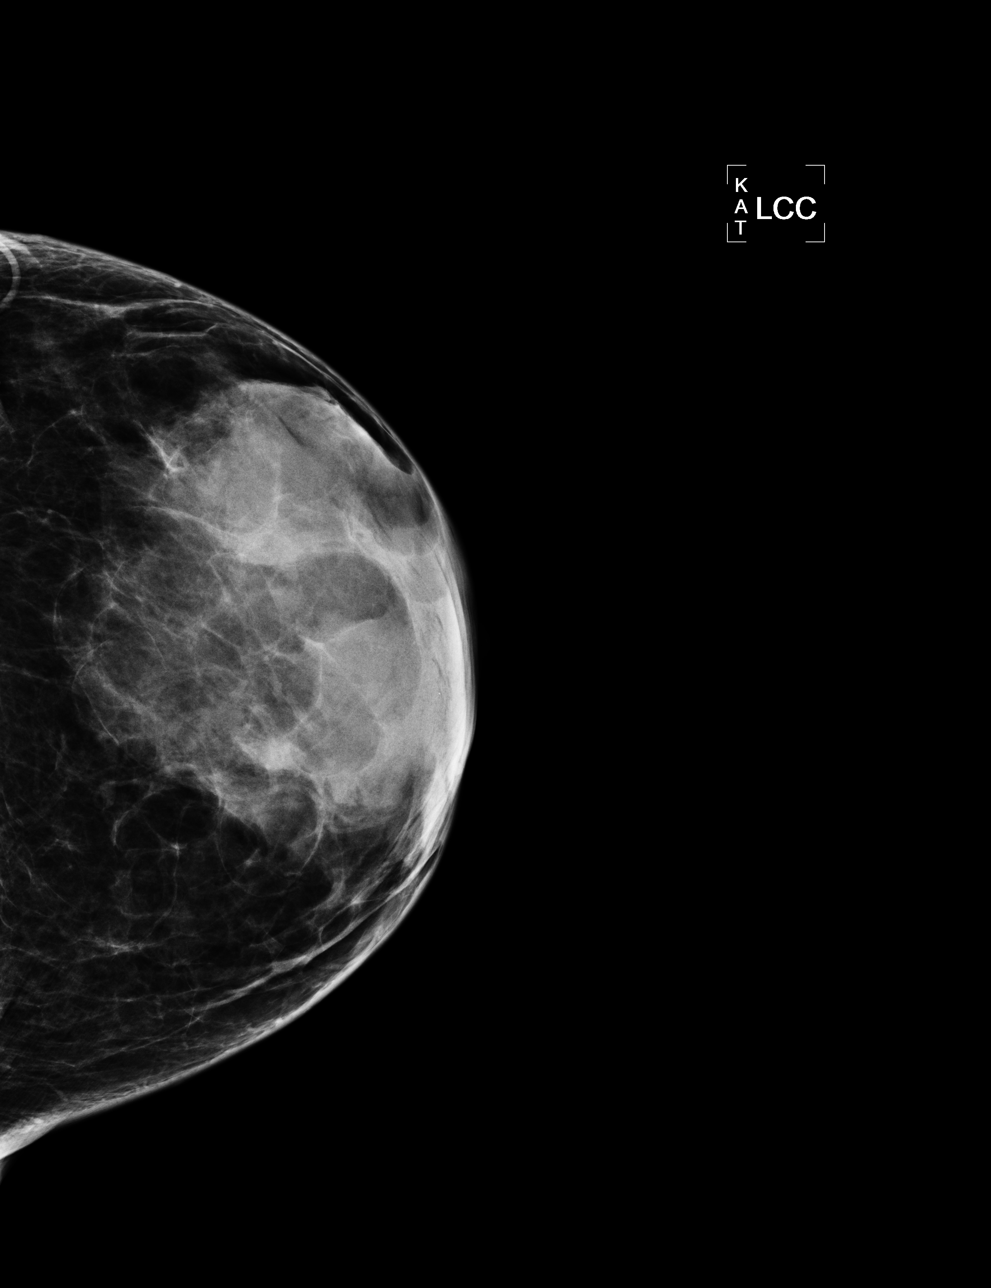

[L MLO]
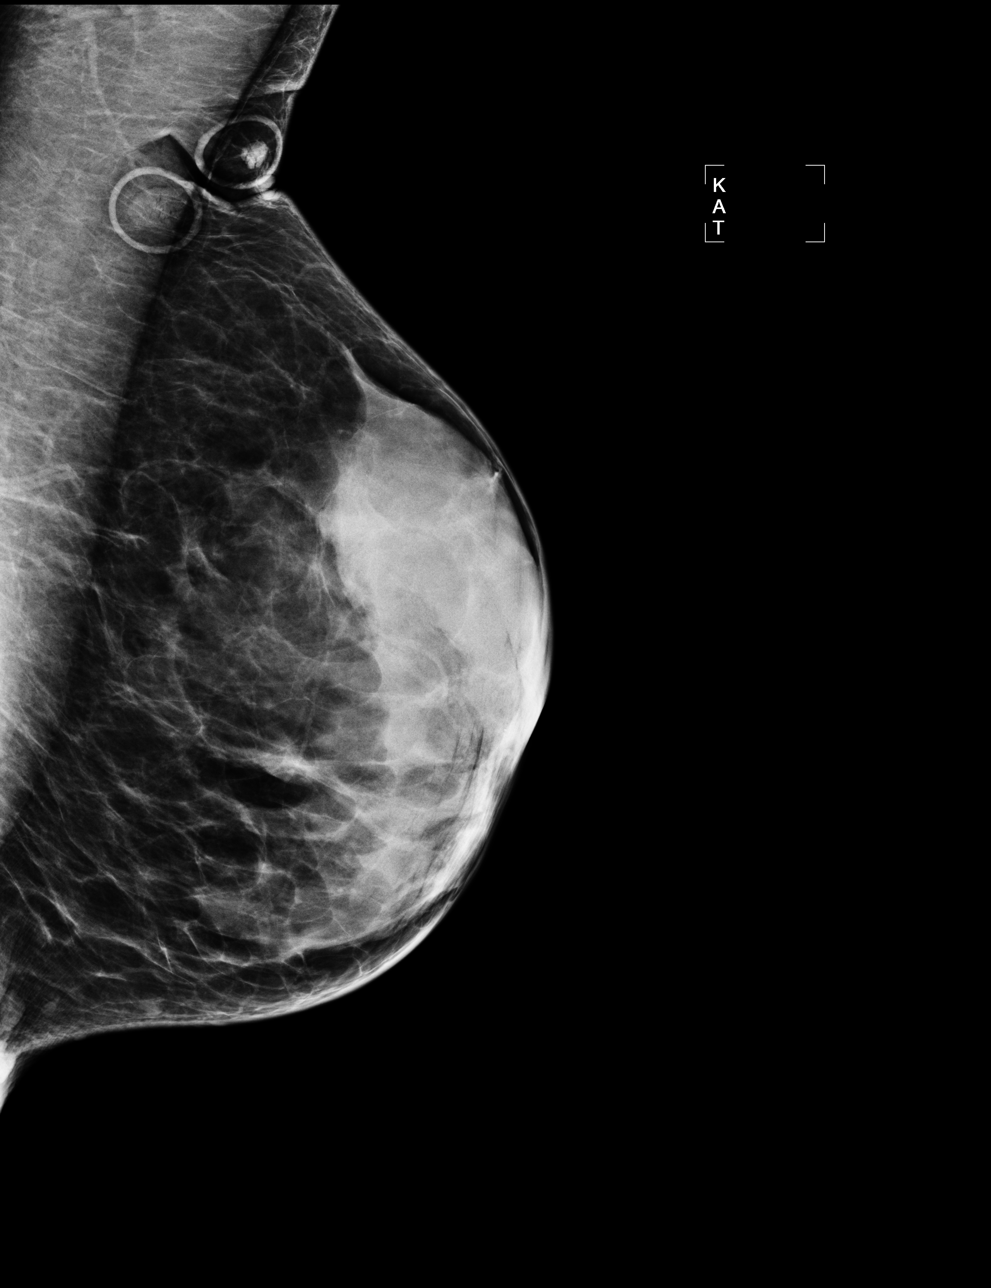

[R MLO]
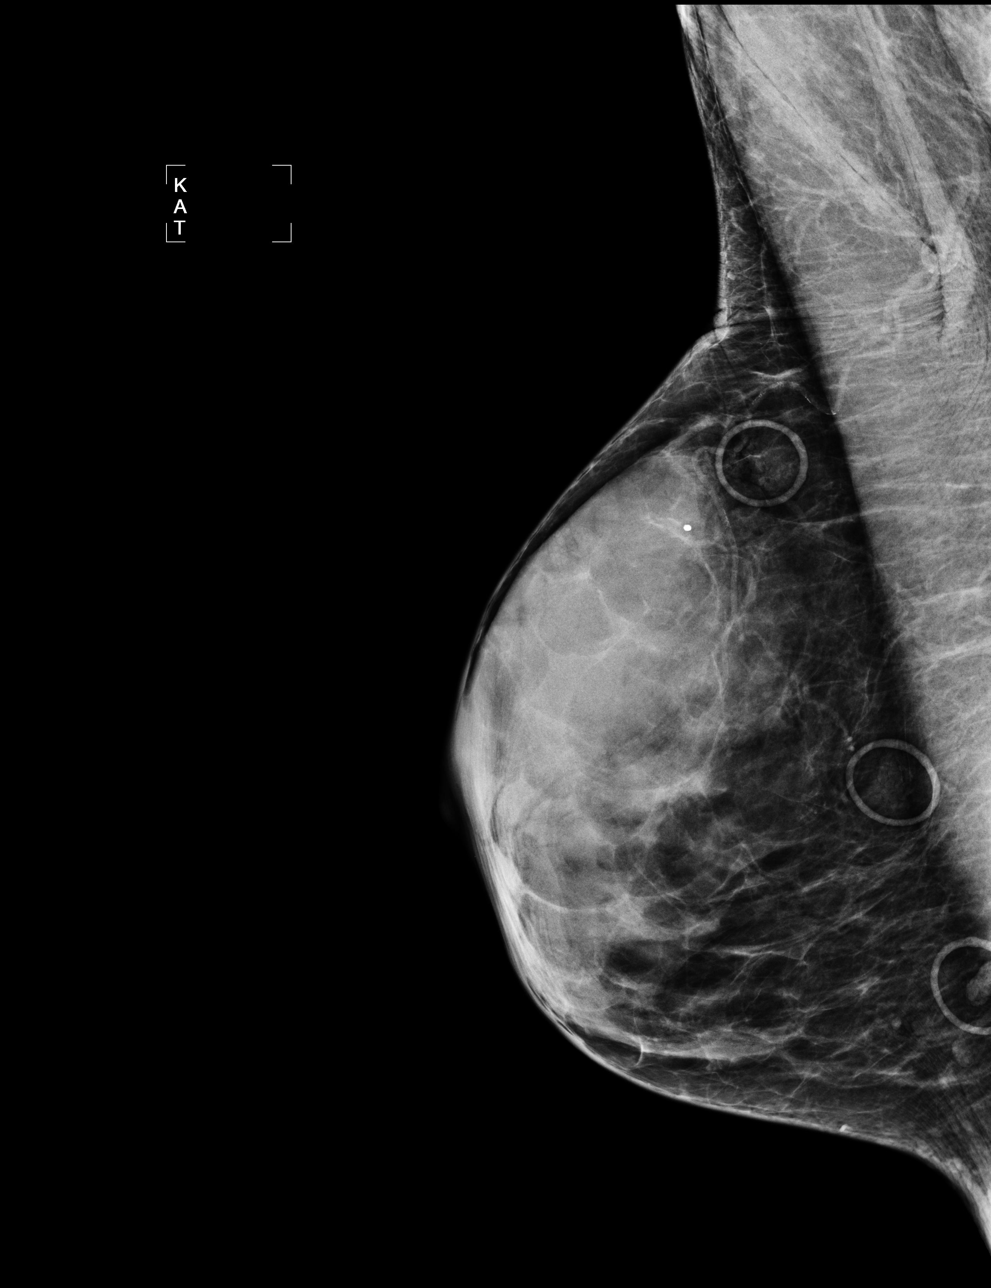

[4 of 4 positions shown; findings below may reference images not displayed]

ACR Breast Density Category d: The breast tissue is extremely dense,
which lowers the sensitivity of mammography.
FINDINGS: There are no findings suspicious for malignancy. Images were
processed with CAD.
IMPRESSION: No mammographic evidence of malignancy. A result letter of this
screening mammogram will be mailed directly to the patient.

RECOMMENDATION:
Screening mammogram in one year. (Code:8A-L-C49)

BI-RADS CATEGORY  1: Negative.

## 2019-03-03 DEATH — deceased
